# Patient Record
Sex: Female | Born: 1960 | Race: Asian | Hispanic: No | Marital: Married | Smoking: Never smoker
Health system: Southern US, Community
[De-identification: ages and names within clinical notes are randomized; demographics above are authoritative.]

## PROBLEM LIST (undated history)

## (undated) DIAGNOSIS — J45909 Unspecified asthma, uncomplicated: Secondary | ICD-10-CM

## (undated) DIAGNOSIS — E039 Hypothyroidism, unspecified: Secondary | ICD-10-CM

## (undated) HISTORY — PX: COLONOSCOPY: SHX174

## (undated) HISTORY — PX: BUNIONECTOMY: SHX129

---

## 2017-08-23 ENCOUNTER — Other Ambulatory Visit (HOSPITAL_BASED_OUTPATIENT_CLINIC_OR_DEPARTMENT_OTHER): Payer: Self-pay | Admitting: Nurse Practitioner

## 2017-08-23 DIAGNOSIS — Z1231 Encounter for screening mammogram for malignant neoplasm of breast: Secondary | ICD-10-CM

## 2017-08-29 ENCOUNTER — Encounter (HOSPITAL_BASED_OUTPATIENT_CLINIC_OR_DEPARTMENT_OTHER): Payer: Self-pay

## 2017-08-29 ENCOUNTER — Ambulatory Visit (HOSPITAL_BASED_OUTPATIENT_CLINIC_OR_DEPARTMENT_OTHER)
Admission: RE | Admit: 2017-08-29 | Discharge: 2017-08-29 | Disposition: A | Discharge status: Home / Self Care | Payer: BLUE CROSS/BLUE SHIELD | Source: Ambulatory Visit | Attending: Nurse Practitioner | Admitting: Nurse Practitioner

## 2017-08-29 DIAGNOSIS — Z1231 Encounter for screening mammogram for malignant neoplasm of breast: Secondary | ICD-10-CM | POA: Diagnosis not present

## 2018-06-13 ENCOUNTER — Inpatient Hospital Stay (HOSPITAL_COMMUNITY)
Admission: EM | Admit: 2018-06-13 | Discharge: 2018-06-18 | DRG: 872 | Disposition: A | Discharge status: Home / Self Care | Payer: BLUE CROSS/BLUE SHIELD | Attending: Internal Medicine | Admitting: Internal Medicine

## 2018-06-13 ENCOUNTER — Emergency Department (HOSPITAL_COMMUNITY): Payer: BLUE CROSS/BLUE SHIELD

## 2018-06-13 DIAGNOSIS — Z79899 Other long term (current) drug therapy: Secondary | ICD-10-CM | POA: Diagnosis not present

## 2018-06-13 DIAGNOSIS — R5383 Other fatigue: Secondary | ICD-10-CM | POA: Diagnosis not present

## 2018-06-13 DIAGNOSIS — R197 Diarrhea, unspecified: Secondary | ICD-10-CM | POA: Diagnosis not present

## 2018-06-13 DIAGNOSIS — Z79890 Hormone replacement therapy (postmenopausal): Secondary | ICD-10-CM

## 2018-06-13 DIAGNOSIS — D72819 Decreased white blood cell count, unspecified: Secondary | ICD-10-CM | POA: Diagnosis not present

## 2018-06-13 DIAGNOSIS — D61818 Other pancytopenia: Secondary | ICD-10-CM | POA: Diagnosis present

## 2018-06-13 DIAGNOSIS — L400 Psoriasis vulgaris: Secondary | ICD-10-CM | POA: Diagnosis not present

## 2018-06-13 DIAGNOSIS — M6282 Rhabdomyolysis: Secondary | ICD-10-CM | POA: Diagnosis present

## 2018-06-13 DIAGNOSIS — A021 Salmonella sepsis: Secondary | ICD-10-CM | POA: Diagnosis present

## 2018-06-13 DIAGNOSIS — E872 Acidosis: Secondary | ICD-10-CM | POA: Diagnosis present

## 2018-06-13 DIAGNOSIS — R740 Nonspecific elevation of levels of transaminase and lactic acid dehydrogenase [LDH]: Secondary | ICD-10-CM | POA: Diagnosis present

## 2018-06-13 DIAGNOSIS — R51 Headache: Secondary | ICD-10-CM | POA: Diagnosis present

## 2018-06-13 DIAGNOSIS — A419 Sepsis, unspecified organism: Secondary | ICD-10-CM | POA: Diagnosis present

## 2018-06-13 DIAGNOSIS — D696 Thrombocytopenia, unspecified: Secondary | ICD-10-CM | POA: Diagnosis not present

## 2018-06-13 DIAGNOSIS — E871 Hypo-osmolality and hyponatremia: Secondary | ICD-10-CM | POA: Diagnosis present

## 2018-06-13 DIAGNOSIS — R7881 Bacteremia: Secondary | ICD-10-CM | POA: Diagnosis not present

## 2018-06-13 DIAGNOSIS — R700 Elevated erythrocyte sedimentation rate: Secondary | ICD-10-CM | POA: Diagnosis present

## 2018-06-13 DIAGNOSIS — R112 Nausea with vomiting, unspecified: Secondary | ICD-10-CM | POA: Diagnosis not present

## 2018-06-13 DIAGNOSIS — Z1611 Resistance to penicillins: Secondary | ICD-10-CM | POA: Diagnosis present

## 2018-06-13 DIAGNOSIS — Z1624 Resistance to multiple antibiotics: Secondary | ICD-10-CM | POA: Diagnosis present

## 2018-06-13 DIAGNOSIS — Z880 Allergy status to penicillin: Secondary | ICD-10-CM | POA: Diagnosis not present

## 2018-06-13 DIAGNOSIS — R509 Fever, unspecified: Secondary | ICD-10-CM

## 2018-06-13 DIAGNOSIS — E039 Hypothyroidism, unspecified: Secondary | ICD-10-CM | POA: Diagnosis present

## 2018-06-13 DIAGNOSIS — Z1623 Resistance to quinolones and fluoroquinolones: Secondary | ICD-10-CM | POA: Diagnosis present

## 2018-06-13 DIAGNOSIS — B9689 Other specified bacterial agents as the cause of diseases classified elsewhere: Secondary | ICD-10-CM

## 2018-06-13 DIAGNOSIS — J302 Other seasonal allergic rhinitis: Secondary | ICD-10-CM | POA: Diagnosis not present

## 2018-06-13 DIAGNOSIS — A0100 Typhoid fever, unspecified: Secondary | ICD-10-CM | POA: Diagnosis not present

## 2018-06-13 DIAGNOSIS — A028 Other specified salmonella infections: Secondary | ICD-10-CM | POA: Diagnosis not present

## 2018-06-13 DIAGNOSIS — E878 Other disorders of electrolyte and fluid balance, not elsewhere classified: Secondary | ICD-10-CM | POA: Diagnosis present

## 2018-06-13 DIAGNOSIS — A029 Salmonella infection, unspecified: Secondary | ICD-10-CM | POA: Diagnosis not present

## 2018-06-13 DIAGNOSIS — R110 Nausea: Secondary | ICD-10-CM | POA: Diagnosis not present

## 2018-06-13 DIAGNOSIS — Z95828 Presence of other vascular implants and grafts: Secondary | ICD-10-CM | POA: Diagnosis not present

## 2018-06-13 HISTORY — DX: Unspecified asthma, uncomplicated: J45.909

## 2018-06-13 HISTORY — DX: Hypothyroidism, unspecified: E03.9

## 2018-06-13 LAB — URINALYSIS, ROUTINE W REFLEX MICROSCOPIC
Bilirubin Urine: NEGATIVE
GLUCOSE, UA: NEGATIVE mg/dL
Ketones, ur: 20 mg/dL — AB
Leukocytes, UA: NEGATIVE
NITRITE: NEGATIVE
PROTEIN: NEGATIVE mg/dL
SPECIFIC GRAVITY, URINE: 1.014 (ref 1.005–1.030)
pH: 6 (ref 5.0–8.0)

## 2018-06-13 LAB — C-REACTIVE PROTEIN: CRP: 17.5 mg/dL — ABNORMAL HIGH (ref <1.0)

## 2018-06-13 LAB — COMPREHENSIVE METABOLIC PANEL
ALT: 87 U/L — AB (ref 0–44)
AST: 125 U/L — ABNORMAL HIGH (ref 15–41)
Albumin: 3.6 g/dL (ref 3.5–5.0)
Alkaline Phosphatase: 82 U/L (ref 38–126)
Anion gap: 9 (ref 5–15)
BUN: 12 mg/dL (ref 6–20)
CO2: 24 mmol/L (ref 22–32)
CREATININE: 0.91 mg/dL (ref 0.44–1.00)
Calcium: 9 mg/dL (ref 8.9–10.3)
Chloride: 100 mmol/L (ref 98–111)
GFR calc non Af Amer: >60 mL/min (ref >60)
Glucose, Bld: 120 mg/dL — ABNORMAL HIGH (ref 70–99)
POTASSIUM: 3.6 mmol/L (ref 3.5–5.1)
SODIUM: 133 mmol/L — AB (ref 135–145)
TOTAL PROTEIN: 7.7 g/dL (ref 6.5–8.1)
Total Bilirubin: 0.8 mg/dL (ref 0.3–1.2)

## 2018-06-13 LAB — CBC WITH DIFFERENTIAL/PLATELET
BASOS ABS: 0 10*3/uL (ref 0.0–0.1)
Basophils Relative: 0 %
EOS ABS: 0 10*3/uL (ref 0.0–0.5)
EOS PCT: 0 %
HCT: 39.9 % (ref 36.0–46.0)
Hemoglobin: 13.1 g/dL (ref 12.0–15.0)
LYMPHS PCT: 27 %
Lymphs Abs: 0.8 10*3/uL (ref 0.7–4.0)
MCH: 26.9 pg (ref 26.0–34.0)
MCHC: 32.8 g/dL (ref 30.0–36.0)
MCV: 81.9 fL (ref 80.0–100.0)
MONO ABS: 0 10*3/uL — AB (ref 0.1–1.0)
Monocytes Relative: 1 %
NRBC: 0 % (ref 0.0–0.2)
Neutro Abs: 2.2 10*3/uL (ref 1.7–7.7)
Neutrophils Relative %: 72 %
PLATELETS: 140 10*3/uL — AB (ref 150–400)
RBC: 4.87 MIL/uL (ref 3.87–5.11)
RDW: 14.3 % (ref 11.5–15.5)
WBC: 3 10*3/uL — AB (ref 4.0–10.5)
nRBC: 0 /100 WBC

## 2018-06-13 LAB — SEDIMENTATION RATE: SED RATE: 44 mm/h — AB (ref 0–22)

## 2018-06-13 LAB — CREATININE, SERUM
Creatinine, Ser: 0.85 mg/dL (ref 0.44–1.00)
GFR calc Af Amer: >60 mL/min (ref >60)
GFR calc non Af Amer: >60 mL/min (ref >60)

## 2018-06-13 LAB — LIPASE, BLOOD: LIPASE: 25 U/L (ref 11–51)

## 2018-06-13 LAB — CBC
HEMATOCRIT: 36.6 % (ref 36.0–46.0)
Hemoglobin: 11.5 g/dL — ABNORMAL LOW (ref 12.0–15.0)
MCH: 26.5 pg (ref 26.0–34.0)
MCHC: 31.4 g/dL (ref 30.0–36.0)
MCV: 84.3 fL (ref 80.0–100.0)
Platelets: 134 10*3/uL — ABNORMAL LOW (ref 150–400)
RBC: 4.34 MIL/uL (ref 3.87–5.11)
RDW: 14.3 % (ref 11.5–15.5)
WBC: 2.8 10*3/uL — AB (ref 4.0–10.5)
nRBC: 0 % (ref 0.0–0.2)

## 2018-06-13 LAB — PARASITE EXAM SCREEN, BLOOD-W CONF TO LABCORP (NOT @ ARMC)

## 2018-06-13 LAB — I-STAT CG4 LACTIC ACID, ED: Lactic Acid, Venous: 1.18 mmol/L (ref 0.5–1.9)

## 2018-06-13 MED ORDER — LEVOTHYROXINE SODIUM 75 MCG PO TABS
75.0000 ug | ORAL_TABLET | Freq: Every day | ORAL | Status: DC
  Administered 2018-06-14 – 2018-06-18 (×5): 75 ug via ORAL
  Filled 2018-06-13 (×5): qty 1

## 2018-06-13 MED ORDER — ENOXAPARIN SODIUM 40 MG/0.4ML ~~LOC~~ SOLN
40.0000 mg | Freq: Every day | SUBCUTANEOUS | Status: DC
  Administered 2018-06-14: 40 mg via SUBCUTANEOUS
  Filled 2018-06-13 (×4): qty 0.4

## 2018-06-13 MED ORDER — POLYETHYLENE GLYCOL 3350 17 G PO PACK: 17.0000 g | PACK | Freq: Every day | ORAL | Status: DC | PRN

## 2018-06-13 MED ORDER — ACETAMINOPHEN 325 MG PO TABS
650.0000 mg | ORAL_TABLET | Freq: Four times a day (QID) | ORAL | Status: DC | PRN
  Administered 2018-06-14 – 2018-06-18 (×9): 650 mg via ORAL
  Filled 2018-06-13 (×10): qty 2

## 2018-06-13 MED ORDER — SODIUM CHLORIDE 0.9% FLUSH
3.0000 mL | Freq: Two times a day (BID) | INTRAVENOUS | Status: DC
  Administered 2018-06-13 – 2018-06-17 (×5): 3 mL via INTRAVENOUS

## 2018-06-13 MED ORDER — SODIUM CHLORIDE 0.9 % IV SOLN
1000.0000 mL | INTRAVENOUS | Status: DC
  Administered 2018-06-13 (×2): 1000 mL via INTRAVENOUS

## 2018-06-13 MED ORDER — SODIUM CHLORIDE 0.9 % IV SOLN
2.0000 g | Freq: Once | INTRAVENOUS | Status: AC
  Administered 2018-06-13: 2 g via INTRAVENOUS
  Filled 2018-06-13: qty 2

## 2018-06-13 MED ORDER — FAMOTIDINE IN NACL 20-0.9 MG/50ML-% IV SOLN
20.0000 mg | Freq: Once | INTRAVENOUS | Status: AC
  Administered 2018-06-13: 20 mg via INTRAVENOUS
  Filled 2018-06-13: qty 50

## 2018-06-13 MED ORDER — ONDANSETRON HCL 4 MG/2ML IJ SOLN
4.0000 mg | Freq: Four times a day (QID) | INTRAMUSCULAR | Status: DC | PRN
  Administered 2018-06-14 – 2018-06-15 (×5): 4 mg via INTRAVENOUS
  Filled 2018-06-13 (×5): qty 2

## 2018-06-13 MED ORDER — IBUPROFEN 800 MG PO TABS
800.0000 mg | ORAL_TABLET | Freq: Once | ORAL | Status: AC
  Administered 2018-06-13: 800 mg via ORAL
  Filled 2018-06-13: qty 1

## 2018-06-13 MED ORDER — ONDANSETRON HCL 4 MG/2ML IJ SOLN
4.0000 mg | Freq: Once | INTRAMUSCULAR | Status: AC
  Administered 2018-06-13: 4 mg via INTRAVENOUS
  Filled 2018-06-13: qty 2

## 2018-06-13 MED ORDER — SODIUM CHLORIDE 0.9 % IV BOLUS (SEPSIS)
1000.0000 mL | Freq: Once | INTRAVENOUS | Status: AC
  Administered 2018-06-13: 1000 mL via INTRAVENOUS

## 2018-06-13 MED ORDER — ACETAMINOPHEN 650 MG RE SUPP: 650.0000 mg | Freq: Four times a day (QID) | RECTAL | Status: DC | PRN

## 2018-06-13 MED ORDER — SODIUM CHLORIDE 0.9 % IV SOLN
500.0000 mg | Freq: Four times a day (QID) | INTRAVENOUS | Status: DC
  Administered 2018-06-14 (×2): 500 mg via INTRAVENOUS
  Filled 2018-06-13 (×4): qty 500

## 2018-06-13 MED ORDER — KETOROLAC TROMETHAMINE 30 MG/ML IJ SOLN: 30.0000 mg | Freq: Four times a day (QID) | INTRAMUSCULAR | Status: DC | PRN

## 2018-06-13 MED ORDER — ONDANSETRON HCL 4 MG PO TABS: 4.0000 mg | ORAL_TABLET | Freq: Four times a day (QID) | ORAL | Status: DC | PRN

## 2018-06-13 NOTE — ED Notes (Signed)
Patient transported to X-ray 

## 2018-06-13 NOTE — Progress Notes (Signed)
Pharmacy Antibiotic Note  Joyce Morgan is a 57 y.o. female admitted on 06/13/2018 with chief complaint of fever, headache, nausea, emesis and diarrhea. Planning to start Primaxin empirically. Tm 103.2.   Plan: -Primaxin 500 mg IV q6h -F/u outside blood cultures -Monitor renal fx, cultures, length of therapy     Temp (24hrs), Avg:103.2 F (39.6 C), Min:103.2 F (39.6 C), Max:103.2 F (39.6 C)  Recent Labs  Lab 06/13/18 1752 06/13/18 1949  WBC 3.0*  --   CREATININE 0.91  --   LATICACIDVEN  --  1.18     Antimicrobials this admission: 11/7 cefepime x1 11/7 Primaxin >  Dose adjustments this admission: N/A  Microbiology results: 11/7 blood cx:   Joyce Morgan 06/13/2018 10:14 PM

## 2018-06-13 NOTE — H&P (Addendum)
Date: 06/13/2018               Patient Name:  Joyce Morgan MRN: [id number]  DOB: Apr 01, 1961 Age / Sex: 57 y.o., female   PCP: Joyce Number, MD         Medical Service: Internal Medicine Teaching Service         Attending Physician: Dr. Rebeca Morgan    First Contact: Dr. Myrtie Morgan  Pager: [phone]  Second Contact: Dr. Tarri Morgan Pager: [phone]       After Hours (After 5p/  First Contact Pager: [phone]  weekends / holidays): Second Contact Pager: [phone]   Chief Complaint: Fever, Nausea, Vomiting, headache   History of Present Illness:   [name] is a 57 year old pleasant woman with hypothyroidism and recent travel to [place] presenting for evaluation of fever, nausea, vomiting and headache.  She recently traveled to [place] from 10/8 to 10/29.  Reports that 3 days after arrival, she began experiencing fevers (102F - 103F) and chills.  Also reports of simultaneously developing headache localized to left temporal and retro-orbital area though she currently does not report of headache.  Since onset, she has also experience nausea and nonbloody nonbilious emesis.  She was evaluated by her PCP and states that she was given an antibiotic injection, steroid and Zofran for presumed diagnosis of pneumonia..  She received a call today and was told her blood culture grew gram-negative rods and was started on Levaquin.  She reports that during her trip to [place], she had a 2-day history of nonbloody diarrhea which resolved with Imodium. She denies neck pain, focal neurological deficits, dysuria, urinary frequency, urinary urgency, flank pain, upper respiratory viral symptoms, chest pain, shortness of breath, myalgia, arthralgia, petechiae or any skin rashes.  She does report of recent tooth filling while she was in [place].  ED course: Febrile to 103.2, tachycardic to 124, RR 16, BP 140/79, SPO2 100% on room air.  CMP shows mild hyponatremia of 133, transaminitis with elevated AST of 125, ALT of  87, CBC with leukopenia and mild thrombocytopenia, elevated ESR of 64, with a CRP of 17.5.  Received 1 L NS and was started on cefepime.   Meds:  Current Meds  Medication Sig  . cetirizine (ZYRTEC) 10 MG tablet Take 10 mg by mouth daily as needed for allergies.  . Cyanocobalamin (VITAMIN B-12 IJ) Inject 1,000 Units as directed every 14 (fourteen) days.  . fexofenadine (ALLEGRA) 180 MG tablet Take 180 mg by mouth daily as needed for allergies or rhinitis.  Marland Kitchen levothyroxine (SYNTHROID, LEVOTHROID) 75 MCG tablet Take 75 mcg by mouth daily before breakfast.  . Vitamin D, Ergocalciferol, (DRISDOL) 1.25 MG (50000 UT) CAPS capsule Take 50,000 Units by mouth every 7 (seven) days.     Allergies: Allergies as of 06/13/2018 - Review Complete 06/13/2018  Allergen Reaction Noted  . Penicillins Rash 06/13/2018   No past medical history on file. -Hypothyroidism - Psoriasis - Migraine headache  Social History: Denies EtOH, tobacco, IV drug use or illicit drug use.  Review of Systems: A complete ROS was negative except as per HPI.   Physical Exam: Blood pressure 140/80, pulse (!) 101, temperature (!) 103.2 F (39.6 C), temperature source Oral, resp. rate 16, SpO2 99 %.  Physical Exam  Constitutional: She is well-developed, well-nourished, and in no distress. No distress.  HENT:  Head: Normocephalic and atraumatic.  Eyes: Conjunctivae are normal.  Neck: Normal range of motion. Neck supple.  Cardiovascular: Tachycardia present. Exam reveals  no gallop and no friction rub.  No murmur heard. Pulmonary/Chest: Effort normal and breath sounds normal. No respiratory distress. She has no wheezes. She has no rales.  Abdominal: Soft. Bowel sounds are normal. She exhibits no distension. There is no tenderness. There is no rebound.  Musculoskeletal: Normal range of motion. She exhibits no edema, tenderness or deformity.  Neurological: She is Morgan.  Skin: Skin is warm. No rash noted. She is not  diaphoretic. No erythema. No pallor.  Psoriatic lesion on the right knee    CXR: personally reviewed my interpretation is unremarkable  Assessment & Plan by Problem: Active Problems:   Fever and chills  Mrs. Joyce Morgan is a 57 year old pleasant woman presenting with 5-day history of fever, headache, nausea, non-bilious nonbloody emesis and 2-day history of nonbloody diarrhea following recent travel to [place].  Gram-negative bacteremia: Suspecting typhoid fever vs. Dengue fever. Recent travel to [place] with 2-day history of nonbloody diarrhea trying to visit.  3 days following the visit she began developing fevers with T-max of 103, right temporal headache, nausea, nonbilious nonbloody emesis.  Evaluated outpatient with blood cultures which grew gram-negative bacteria.  Denies neck pain, focal neurological deficits, myalgia, arthralgia, petechiae, chest pain, cough, UTI symptoms.  On arrival to the ED, patient was febrile to 103, tachycardic with range 101-124, CMP shows mild hyponatremia of 133, transaminitis with elevated AST of 125, ALT of 87, CBC with leukopenia and mild thrombocytopenia, elevated ESR of 64, with a CRP of 17.5.  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  Infectious disease was consulted and advised for patient to be started on carbapenem.  Given her recent history of tooth filling, bacteremia from an oral pharyngeal source cannot be ruled out. -s/p cefepime, normal saline bolus -Continue imipenem-cilastatin -Follow-up urinalysis, blood culture, lactic acid, blood smear -Follow-up a.m. CBC, CMP - Zofran as needed nausea, Toradol 30 mg q6 during  headache  Hypothyroidism: Continue levothyroxine  History of headaches: Patient reports that she takes sumatriptan  FEN: Replace electrolytes as needed, he continues maintenance normal saline at 125 mL/hour VTE prophylaxis: Subcutaneous Lovenox CODE STATUS: Full code  Dispo: Admit patient to Inpatient with expected length of stay greater than 2 midnights.  Signed: Jean Rosenthal, MD 06/13/2018, 9:44 PM  Pager: [phone] [facility] PGY-1

## 2018-06-13 NOTE — ED Provider Notes (Signed)
[facility] EMERGENCY DEPARTMENT Provider Note   CSN: [id number] Arrival date & time: 06/13/18  1632     History   Chief Complaint Chief Complaint  Patient presents with  . Nausea  . Fever    HPI Joyce Morgan is a 57 y.o. female.  HPI Patient reports that she started becoming sick 5 days ago.  On the first day she had chills and a fever.  She reports on that day she had a headache.  The headache was moderate and generalized.  She was seen by her doctor and given an antibiotic shot, steroid and Zofran.  She reports that the fever did not go away.  She has continued to have a fever for all 5 days since it started.  She reports that the headache has resolved and she no longer has that.  She reports she has continued to have nausea and vomiting if she tries to eat something.  She reports that she was seen at [facility] hospital 2 days ago and diagnostic testing was negative.  She reports that influenza tests have been done twice and are negative.  She was given a prescription for Levaquin which she just started today and vomited.  She reports that urinalysis reportedly has tested negative each time.  She denies any chest pain, cough, shortness of breath, abdominal pain, diarrhea.  No pain or burning with urination.  Patient reports she has minimal past medical history.  She takes levothyroxine and a medication for seasonal allergies.  She does have recent travel history.  She reports that she was in [place] in the end of September.  She then was in [place] for about 3 weeks.  She reports that she was never sick for the whole time she was there. No past medical history on file.  Patient Active Problem List   Diagnosis Date Noted  . Fever and chills 06/13/2018     The histories are not reviewed yet. Please review them in the "History" navigator section and refresh this SmartLink.   OB History   None      Home Medications    Prior to Admission medications     Medication Sig Start Date End Date Taking? Authorizing Provider  cetirizine (ZYRTEC) 10 MG tablet Take 10 mg by mouth daily as needed for allergies.   Yes [provider]  Cyanocobalamin (VITAMIN B-12 IJ) Inject 1,000 Units as directed every 14 (fourteen) days.   Yes [provider]  fexofenadine (ALLEGRA) 180 MG tablet Take 180 mg by mouth daily as needed for allergies or rhinitis.   Yes [provider]  levothyroxine (SYNTHROID, LEVOTHROID) 75 MCG tablet Take 75 mcg by mouth daily before breakfast.   Yes [provider]  Vitamin D, Ergocalciferol, (DRISDOL) 1.25 MG (50000 UT) CAPS capsule Take 50,000 Units by mouth every 7 (seven) days.   Yes [provider]    Family History No family history on file.  Social History Social History   Tobacco Use  . Smoking status: Not on file  Substance Use Topics  . Alcohol use: Not on file  . Drug use: Not on file     Allergies   Penicillins   Review of Systems Review of Systems 10 Systems reviewed and are negative for acute change except as noted in the HPI.  Physical Exam Updated Vital Signs BP 102/81   Pulse 90   Temp 99.8 F (37.7 C) (Oral)   Resp 20   Ht 5\' 2"  (1.575  m)   Wt 76.7 kg   SpO2 97%   BMI 30.91 kg/m   Physical Exam  Constitutional: She is oriented to person, place, and time. She appears well-developed and well-nourished.  Patient is clinically well in appearance.  She is alert and nontoxic.  No signs of distress.  HENT:  Head: Normocephalic and atraumatic.  Bilateral TMs normal.  Mucous membranes are slightly dry on the tongue.  Posterior oropharynx is widely patent.  No erythema, exudate or ulcerative lesions.  Eyes: Pupils are equal, round, and reactive to light. EOM are normal.  Neck: Neck supple.  Cardiovascular: Normal rate, regular rhythm, normal heart sounds and intact distal pulses.  Pulmonary/Chest: Effort normal and breath sounds normal.  Abdominal:  Soft. Bowel sounds are normal. She exhibits no distension. There is no tenderness.  Musculoskeletal: Normal range of motion. She exhibits no edema or tenderness.  Patient has a plaque of psoriasis on the right knee.  This is chronic.  No surrounding erythema.  Lower extremities are without edema.  Calves are soft and nontender.  Neurological: She is alert and oriented to person, place, and time. She has normal strength. She exhibits normal muscle tone. Coordination normal. GCS eye subscore is 4. GCS verbal subscore is 5. GCS motor subscore is 6.  Skin: Skin is warm, dry and intact.  Psychiatric: She has a normal mood and affect.     ED Treatments / Results  Labs (all labs ordered are listed, but only abnormal results are displayed) Labs Reviewed  COMPREHENSIVE METABOLIC PANEL - Abnormal; Notable for the following components:      Result Value   Sodium 133 (*)    Glucose, Bld 120 (*)    AST 125 (*)    ALT 87 (*)    All other components within normal limits  CBC WITH DIFFERENTIAL/PLATELET - Abnormal; Notable for the following components:   WBC 3.0 (*)    Platelets 140 (*)    Monocytes Absolute 0.0 (*)    All other components within normal limits  URINALYSIS, ROUTINE W REFLEX MICROSCOPIC - Abnormal; Notable for the following components:   Hgb urine dipstick MODERATE (*)    Ketones, ur 20 (*)    Bacteria, UA RARE (*)    All other components within normal limits  SEDIMENTATION RATE - Abnormal; Notable for the following components:   Sed Rate 44 (*)    All other components within normal limits  C-REACTIVE PROTEIN - Abnormal; Notable for the following components:   CRP 17.5 (*)    All other components within normal limits  CBC - Abnormal; Notable for the following components:   WBC 2.8 (*)    Hemoglobin 11.5 (*)    Platelets 134 (*)    All other components within normal limits  CULTURE, BLOOD (ROUTINE X 2)  CULTURE, BLOOD (ROUTINE X 2)  LIPASE, BLOOD  PARASITE EXAM SCREEN,  BLOOD-W CONF TO [facility] (NOT @ [facility])  CREATININE, SERUM  HIV ANTIBODY (ROUTINE TESTING W REFLEX)  COMPREHENSIVE METABOLIC PANEL  CBC  PARASITE EXAM, BLOOD  I-STAT CG4 LACTIC ACID, ED  I-STAT CG4 LACTIC ACID, ED    EKG None  Radiology Dg Chest 2 View  Result Date: 06/13/2018 CLINICAL DATA:  Fever. EXAM: CHEST - 2 VIEW COMPARISON:  None. FINDINGS: The heart size and mediastinal contours are within normal limits. Both lungs are clear. The visualized skeletal structures are unremarkable. IMPRESSION: No active cardiopulmonary disease. Electronically Signed   By: Gerome Sam III M.D  On: 06/13/2018 20:34    Procedures Procedures (including critical care time)  Medications Ordered in ED Medications  sodium chloride 0.9 % bolus 1,000 mL (0 mLs Intravenous Stopped 06/13/18 2114)    Followed by  0.9 %  sodium chloride infusion (1,000 mLs Intravenous New Bag/Given 06/13/18 2327)  enoxaparin (LOVENOX) injection 40 mg (has no administration in time range)  sodium chloride flush (NS) 0.9 % injection 3 mL (has no administration in time range)  acetaminophen (TYLENOL) tablet 650 mg (has no administration in time range)    Or  acetaminophen (TYLENOL) suppository 650 mg (has no administration in time range)  ondansetron (ZOFRAN) tablet 4 mg (has no administration in time range)    Or  ondansetron (ZOFRAN) injection 4 mg (has no administration in time range)  polyethylene glycol (MIRALAX / GLYCOLAX) packet 17 g (has no administration in time range)  levothyroxine (SYNTHROID, LEVOTHROID) tablet 75 mcg (has no administration in time range)  ketorolac (TORADOL) 30 MG/ML injection 30 mg (has no administration in time range)  imipenem-cilastatin (PRIMAXIN) 500 mg in sodium chloride 0.9 % 100 mL IVPB (has no administration in time range)  ibuprofen (ADVIL,MOTRIN) tablet 800 mg (800 mg Oral Given 06/13/18 2225)  famotidine (PEPCID) IVPB 20 mg premix (0 mg Intravenous Stopped 06/13/18 1818)   ondansetron (ZOFRAN) injection 4 mg (4 mg Intravenous Given 06/13/18 1748)  ceFEPIme (MAXIPIME) 2 g in sodium chloride 0.9 % 100 mL IVPB (0 g Intravenous Stopped 06/13/18 2127)     Initial Impression / Assessment and Plan / ED Course  I have reviewed the triage vital signs and the nursing notes.  Pertinent labs & imaging results that were available during my care of the patient were reviewed by me and considered in my medical decision making (see chart for details).  Clinical Course as of Jun 14 52  Thu Jun 13, 2018  1722 If patient needs admission, plan to admit to internal medicine teaching service even if capped.   [MP]    Clinical Course User Index [MP] Arby Barrette, MD   Patient presents with fever that started 5 days ago.  She has had travel to [place].  Differential includes dengue, typhoid, other bacterial infection.  Patient is alert and appropriate.  She is not showing signs of being septic with endorgan damage..  No hypotension, no lactic acidosis.  Patient is tachycardic.  She reports vomiting.  Fluid resuscitation initiated.  Broad-spectrum antibiotics initiated based on a report showing  gram-negative rods from prior blood culture done in outside facility.  Patient admitted to internal medicine teaching service.  Consultation to Dr. Earle Gell of infectious disease called and forwarded to admitting physicians.  Final Clinical Impressions(s) / ED Diagnoses   Final diagnoses:  Fever, unspecified fever cause  Bacteremia    ED Discharge Orders    None       Arby Barrette, MD 06/20/18 [phone]

## 2018-06-13 NOTE — ED Notes (Signed)
Unable to collect istat lactic acid d/t limited blood. Per RN can be collected off next lab order. [employer]

## 2018-06-13 NOTE — ED Notes (Signed)
Delay in abx start due to culture draw difficulties.

## 2018-06-13 NOTE — ED Triage Notes (Signed)
Pt c/o "mild" headache and n/v x5 days with increasing symptoms over the past 2 days. Pt states she has been seen by multiple providers over the past few days without resolve. Pt vomited since arrival to ED. EDP at bedside.

## 2018-06-14 ENCOUNTER — Other Ambulatory Visit: Payer: Self-pay

## 2018-06-14 ENCOUNTER — Encounter (HOSPITAL_COMMUNITY): Payer: Self-pay | Admitting: *Deleted

## 2018-06-14 DIAGNOSIS — A419 Sepsis, unspecified organism: Secondary | ICD-10-CM | POA: Diagnosis present

## 2018-06-14 DIAGNOSIS — J302 Other seasonal allergic rhinitis: Secondary | ICD-10-CM

## 2018-06-14 DIAGNOSIS — R509 Fever, unspecified: Secondary | ICD-10-CM

## 2018-06-14 DIAGNOSIS — D61818 Other pancytopenia: Secondary | ICD-10-CM | POA: Diagnosis present

## 2018-06-14 DIAGNOSIS — E039 Hypothyroidism, unspecified: Secondary | ICD-10-CM

## 2018-06-14 DIAGNOSIS — R112 Nausea with vomiting, unspecified: Secondary | ICD-10-CM

## 2018-06-14 DIAGNOSIS — R51 Headache: Secondary | ICD-10-CM

## 2018-06-14 DIAGNOSIS — R197 Diarrhea, unspecified: Secondary | ICD-10-CM

## 2018-06-14 DIAGNOSIS — D72819 Decreased white blood cell count, unspecified: Secondary | ICD-10-CM

## 2018-06-14 DIAGNOSIS — R7881 Bacteremia: Secondary | ICD-10-CM

## 2018-06-14 DIAGNOSIS — D696 Thrombocytopenia, unspecified: Secondary | ICD-10-CM

## 2018-06-14 DIAGNOSIS — Z880 Allergy status to penicillin: Secondary | ICD-10-CM

## 2018-06-14 DIAGNOSIS — M6282 Rhabdomyolysis: Secondary | ICD-10-CM | POA: Diagnosis present

## 2018-06-14 LAB — BLOOD CULTURE ID PANEL (REFLEXED)
Acinetobacter baumannii: NOT DETECTED — AB
Candida albicans: NOT DETECTED — AB
Candida glabrata: NOT DETECTED — AB
Candida krusei: NOT DETECTED — AB
Candida parapsilosis: NOT DETECTED — AB
Candida tropicalis: NOT DETECTED — AB
Carbapenem resistance: NOT DETECTED — AB
Enterobacter cloacae complex: NOT DETECTED — AB
Enterobacteriaceae species: DETECTED — AB
Enterococcus species: NOT DETECTED — AB
Escherichia coli: NOT DETECTED — AB
Haemophilus influenzae: NOT DETECTED — AB
Klebsiella oxytoca: NOT DETECTED — AB
Klebsiella pneumoniae: NOT DETECTED — AB
Listeria monocytogenes: NOT DETECTED — AB
Methicillin resistance: NOT DETECTED — AB
Neisseria meningitidis: NOT DETECTED — AB
Proteus species: NOT DETECTED — AB
Pseudomonas aeruginosa: NOT DETECTED — AB
Serratia marcescens: NOT DETECTED — AB
Staphylococcus aureus (BCID): NOT DETECTED — AB
Staphylococcus species: NOT DETECTED — AB
Streptococcus agalactiae: NOT DETECTED — AB
Streptococcus pneumoniae: NOT DETECTED — AB
Streptococcus pyogenes: NOT DETECTED — AB
Streptococcus species: NOT DETECTED — AB
Vancomycin resistance: NOT DETECTED — AB

## 2018-06-14 LAB — CBC
HCT: 35.2 % — ABNORMAL LOW (ref 36.0–46.0)
Hemoglobin: 11.2 g/dL — ABNORMAL LOW (ref 12.0–15.0)
MCH: 26.1 pg (ref 26.0–34.0)
MCHC: 31.8 g/dL (ref 30.0–36.0)
MCV: 82.1 fL (ref 80.0–100.0)
Platelets: 111 10*3/uL — ABNORMAL LOW (ref 150–400)
RBC: 4.29 MIL/uL (ref 3.87–5.11)
RDW: 14.4 % (ref 11.5–15.5)
WBC: 3.2 10*3/uL — ABNORMAL LOW (ref 4.0–10.5)
nRBC: 0 % (ref 0.0–0.2)

## 2018-06-14 LAB — COMPREHENSIVE METABOLIC PANEL WITH GFR
ALT: 63 U/L — ABNORMAL HIGH (ref 0–44)
AST: 94 U/L — ABNORMAL HIGH (ref 15–41)
Albumin: 2.8 g/dL — ABNORMAL LOW (ref 3.5–5.0)
Alkaline Phosphatase: 63 U/L (ref 38–126)
Anion gap: 8 (ref 5–15)
BUN: 8 mg/dL (ref 6–20)
CO2: 18 mmol/L — ABNORMAL LOW (ref 22–32)
Calcium: 8 mg/dL — ABNORMAL LOW (ref 8.9–10.3)
Chloride: 110 mmol/L (ref 98–111)
Creatinine, Ser: 0.92 mg/dL (ref 0.44–1.00)
GFR calc Af Amer: >60 mL/min
GFR calc non Af Amer: >60 mL/min
Glucose, Bld: 114 mg/dL — ABNORMAL HIGH (ref 70–99)
Potassium: 3.1 mmol/L — ABNORMAL LOW (ref 3.5–5.1)
Sodium: 136 mmol/L (ref 135–145)
Total Bilirubin: 0.9 mg/dL (ref 0.3–1.2)
Total Protein: 6.1 g/dL — ABNORMAL LOW (ref 6.5–8.1)

## 2018-06-14 LAB — HEMOGLOBIN A1C
Hgb A1c MFr Bld: 5.6 % (ref 4.8–5.6)
Mean Plasma Glucose: 114.02 mg/dL

## 2018-06-14 LAB — HIV ANTIBODY (ROUTINE TESTING W REFLEX): HIV Screen 4th Generation wRfx: NONREACTIVE

## 2018-06-14 LAB — CK: Total CK: 1644 U/L — ABNORMAL HIGH (ref 38–234)

## 2018-06-14 MED ORDER — POTASSIUM CHLORIDE CRYS ER 20 MEQ PO TBCR
60.0000 meq | EXTENDED_RELEASE_TABLET | Freq: Once | ORAL | Status: AC
  Administered 2018-06-14: 60 meq via ORAL
  Filled 2018-06-14: qty 3

## 2018-06-14 MED ORDER — SODIUM CHLORIDE 0.9 % IV SOLN
1.0000 g | Freq: Three times a day (TID) | INTRAVENOUS | Status: DC
  Administered 2018-06-14 – 2018-06-18 (×12): 1 g via INTRAVENOUS
  Filled 2018-06-14 (×14): qty 1

## 2018-06-14 MED ORDER — CALCIUM CARBONATE ANTACID 500 MG PO CHEW
2.0000 | CHEWABLE_TABLET | Freq: Three times a day (TID) | ORAL | Status: DC | PRN
  Administered 2018-06-14: 400 mg via ORAL
  Filled 2018-06-14: qty 2

## 2018-06-14 NOTE — Progress Notes (Signed)
Called [facility] about culture results. Unfortunately they are still pending but expected to be complete on 06/15/2018. I have requested that they fax the results to the IM office but will attempt to obtain the results tomorrow.   Phone #: [phone] Specimen ID: [id number]

## 2018-06-14 NOTE — Progress Notes (Signed)
Subjective: Joyce Morgan was seen and evaluated at bedside on morning rounds. She endorses that she feels much better today than a week ago. Denies any fever, body ache, cough. No diarrhea. No more headache. Denies any sick contact during her trip or after that. We talked about her lab result and plan of care. She endorses that she has a recent abdominal Ct scan in another center and will provide [place] information and agreement for releasing the result so we can follow up with the result.   Objective:  Vital signs in last 24 hours: Vitals:   06/13/18 2242 06/13/18 2318 06/14/18 0047 06/14/18 0438  BP:  102/81 109/67 125/72  Pulse:  90 81 75  Resp:  _0 Temp: (!) 103 F (39.4 C) 99.8 F (37.7 C) 99.5 F (37.5 C) 98.9 F (37.2 C)  TempSrc: Oral Oral Oral Oral  SpO2:  97% 99% 99%  Weight:  76.7 kg    Height:  5' 2" (1.575 m)     Physical exam: General: Pleasant lady, lying in bed in no acute distress. Head and neck: Small non tender, flexible,  ymph node palpable at left side of the neck CV: RRR, no murmur Lungs: CTA bilaterally: No rale, no wheeze Abdomen: BS are present, abdomen is soft and non tender Extremities: Psoriatic skin lesion on right knee, no lower extremity edema, pulses are palpable and normal bilaterally Psychiatric: Normal mood and affect and behavior, normal judgment Neurologic exam: Alert and oriented x3, no neurologic deficit  Assessment/Plan:  Active Problems:   Fever and chills Ms. Loughner is a 57 year old female presented with fever and chills, and reported gram-negative bacteremia on blood culture obtained in outpatient clinic.  She had a recent travel to [place].  Sepsis with Gr - rod bacteriemia: Arrival: Patient had fever, leukopenia, mild thrombocytopenia ESR is 64 and CRP of 17.5 on arrival transaminitis with AST of 125 and ALT of 87. No plasmodium or other parasites on blood smear.  Final results still pending ID is on board. Differential: This  dengue versus typhoid fever versus viral gastroenteritis.  Gram-negative bacteremia more concerning for typhoid.  -Changed imipenem-cilastatin to meropenem by ID for formulary reasons. -Follow-up blood culture results from [facility] -C/w Tylenol PRN for pain and Zofran PRN  Hypothyroidism: Continue levothyroxine  Dispo: Anticipated discharge in approximately 2-3 days  Joyce Hatch, MD 06/14/2018, 8:16 AM Pager: [phone]

## 2018-06-14 NOTE — Progress Notes (Signed)
   06/14/18 1856  MEWS Score  Temp (!) 103.1 F (39.5 C)  MEWS Score  MEWS RR 0  MEWS Pulse 0  MEWS Systolic 0  MEWS LOC 0  MEWS Temp 2  MEWS Score 2  MEWS Score Color Yellow  Pt given 650mg  tylenol PO.

## 2018-06-14 NOTE — Progress Notes (Signed)
PHARMACY - PHYSICIAN COMMUNICATION CRITICAL VALUE ALERT - BLOOD CULTURE IDENTIFICATION (BCID)  Joyce Morgan is an 57 y.o. female who presented to [facility] on 06/13/2018 with gram negative rod bacteremia after returning from [place]. Suspicion for Salmonella typhi infection.   Assessment:  BCID revealed Enterobacteriaceae species, 1 of 4 bottle thus far.  Name of physician (or Provider) Contacted: Dr. Maryla Morrow  Current antibiotics: Merrem  Changes to prescribed antibiotics recommended:  Patient is on recommended antibiotics - No changes needed  F/U identification of pathogen and sensitivity before narrowing   Results for orders placed or performed during the hospital encounter of 06/13/18  Blood Culture ID Panel (Reflexed) (Collected: 06/13/2018  7:40 PM)  Result Value Ref Range   Enterococcus species NONE DETECTED (A) NOT DETECTED   Vancomycin resistance NONE DETECTED (A) NOT DETECTED   Listeria monocytogenes NONE DETECTED (A) NOT DETECTED   Staphylococcus species NONE DETECTED (A) NOT DETECTED   Staphylococcus aureus (BCID) NONE DETECTED (A) NOT DETECTED   Methicillin resistance NONE DETECTED (A) NOT DETECTED   Streptococcus species NONE DETECTED (A) NOT DETECTED   Streptococcus agalactiae NONE DETECTED (A) NOT DETECTED   Streptococcus pneumoniae NONE DETECTED (A) NOT DETECTED   Streptococcus pyogenes NONE DETECTED (A) NOT DETECTED   Acinetobacter baumannii NONE DETECTED (A) NOT DETECTED   Enterobacteriaceae species DETECTED (A) NOT DETECTED   Enterobacter cloacae complex NONE DETECTED (A) NOT DETECTED   Escherichia coli NONE DETECTED (A) NOT DETECTED   Klebsiella oxytoca NONE DETECTED (A) NOT DETECTED   Klebsiella pneumoniae NONE DETECTED (A) NOT DETECTED   Proteus species NONE DETECTED (A) NOT DETECTED   Serratia marcescens NONE DETECTED (A) NOT DETECTED   Carbapenem resistance NONE DETECTED (A) NOT DETECTED   Haemophilus influenzae NONE DETECTED (A) NOT DETECTED   Neisseria meningitidis NONE DETECTED (A) NOT DETECTED   Pseudomonas aeruginosa NONE DETECTED (A) NOT DETECTED   Candida albicans NONE DETECTED (A) NOT DETECTED   Candida glabrata NONE DETECTED (A) NOT DETECTED   Candida krusei NONE DETECTED (A) NOT DETECTED   Candida parapsilosis NONE DETECTED (A) NOT DETECTED   Candida tropicalis NONE DETECTED (A) NOT DETECTED     Thamara Leger D. Laney Potash, PharmD, BCPS, BCCCP 06/14/2018, 6:47 PM

## 2018-06-14 NOTE — Consult Note (Signed)
[facility]    Date of Admission:  06/13/2018          Reason for Consult: Gram negative bacteremia    Referring Provider: Dr. Rebeca Alert Primary Care Provider: Celedonio Miyamoto P  Assessment: [name] is a 57 yo woman found to have gram-negative bacteremia after recent travel to [place]. Pt was febrile to 103 on admission which has now resolved to 98.9. Parasite negative for plasmodium or other blood parasites on thin smear, final results still pending. Chest xray unremarkable. She remains on imipenem-cilastatin. Labs significant for AST of 125, ALT of 87, CBC with leukopenia and mild thrombocytopenia, elevated ESR of 64, with a CRP of 17.5. Differential includes dengue vs typhoid, vs viral gastroenteritis, but clinical picture much more consistent with typhoid in the setting of GNR bacteremia.   Plan: 1. Continue imipenem-cilastatin - I will change to meropenem for formulary reasons.  2. Follow micro cultures  3.  Patient advised on pretravel consultation prior to next visit   Active Problems:   Fever and chills   Scheduled Meds: . enoxaparin (LOVENOX) injection  40 mg Subcutaneous Daily  . levothyroxine  75 mcg Oral Q0600  . sodium chloride flush  3 mL Intravenous Q12H   Continuous Infusions: . imipenem-cilastatin 500 mg (06/14/18 0655)   PRN Meds:.acetaminophen **OR** acetaminophen, ketorolac, ondansetron **OR** ondansetron (ZOFRAN) IV, polyethylene glycol  HPI: Joyce Morgan is a 57 y.o. female originally from [place], recently went back to [place] from 10/8-10/29 and developed fever, chills, N/V.  Some headache but no retrobulbar headache.  Fever to 103, now more diarrhea today.  Recently had blood cultures by her PCP and growing GNRs and intially given levaquin but vomited and came in.  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  She did have an episode of presumed traveler's diarrhea with some  blood but resolved.  [place] a resort setting.     Review of Systems: Review of Systems  Constitutional: Positive for chills, fever and malaise/fatigue.  Eyes: Negative for blurred vision, double vision, photophobia and pain.  Respiratory: Negative for cough and shortness of breath.   Cardiovascular: Negative for chest pain.  Gastrointestinal: Positive for diarrhea, nausea and vomiting. Negative for abdominal pain, blood in stool and constipation.  Genitourinary: Negative.   Musculoskeletal: Negative.   Skin: Negative for itching and rash.  Neurological: Positive for headaches.    PMH: seasonal allergies, hypothyroidism  Social History   Tobacco Use  . Smoking status: Not on file  Substance Use Topics  . Alcohol use: Not on file  . Drug use: Not on file  No alcohol or drug use  FMH: + cardiac disease  Allergies  Allergen Reactions  . Penicillins Rash    OBJECTIVE: Blood pressure 125/72, pulse 75, temperature 98.9 F (37.2 C), temperature source Oral, resp. rate 18, height 5' 2"  (1.575 m), weight 76.7 kg, SpO2 99 %.  Physical Exam  Constitutional: She is oriented to person, place, and time. She appears well-developed and well-nourished.  HENT:  Head: Normocephalic and atraumatic.  Eyes: EOM are normal.  Neck: Normal range of motion.  Cardiovascular: Normal rate, regular rhythm and normal heart sounds.  Pulmonary/Chest: Effort normal and breath sounds normal. No respiratory distress.  Abdominal: Soft. She exhibits no distension and no mass. There is no tenderness. There is no guarding.  Musculoskeletal: Normal range of motion.  Neurological: She is alert and  oriented to person, place, and time.  Skin: Skin is warm and dry.  Psychiatric: She has a normal mood and affect.    Lab Results Lab Results  Component Value Date   WBC 3.2 (L) 06/14/2018   HGB 11.2 (L) 06/14/2018   HCT 35.2 (L) 06/14/2018   MCV 82.1 06/14/2018   PLT 111 (L) 06/14/2018    Lab  Results  Component Value Date   CREATININE 0.92 06/14/2018   BUN 8 06/14/2018   NA 136 06/14/2018   K 3.1 (L) 06/14/2018   CL 110 06/14/2018   CO2 18 (L) 06/14/2018    Lab Results  Component Value Date   ALT 63 (H) 06/14/2018   AST 94 (H) 06/14/2018   ALKPHOS 63 06/14/2018   BILITOT 0.9 06/14/2018     Microbiology: No results found for this or any previous visit (from the past 240 hour(s)).  April  Dione Booze, [facility] [phone] pager   [phone] cell 06/14/2018, 8:52 AM

## 2018-06-14 NOTE — Progress Notes (Signed)
Pharmacy Antibiotic Note  Joyce Morgan is a 57 y.o. female admitted on 06/13/2018 with gram negative rod bacteremia after returning from [place]. Suspicion for Salmonella typhi infection.   Pharmacy has been consulted for meropenem dosing. Patient has been treated with imipenem-cilastatin but will change to meropenem at this time to align with formulary. Meropenem will provide coverage for possible MDR S. Typhi that is of concern in travelers from [place]. Patient WBC count today is slightly improved to 3.2 and Tmax- 103.2. Scr is stable at 0.92.   Plan: Meropenem 1 gm Q 8 hours Monitor renal function, clinical status and blood cultures from PCP office  Height: 5\' 2"  (157.5 cm) Weight: 169 lb (76.7 kg) IBW/kg (Calculated) : 50.1  Temp (24hrs), Avg:100.9 F (38.3 C), Min:98.9 F (37.2 C), Max:103.2 F (39.6 C)  Recent Labs  Lab 06/13/18 1752 06/13/18 1949 06/13/18 2201 06/14/18 0240  WBC 3.0*  --  2.8* 3.2*  CREATININE 0.91  --  0.85 0.92  LATICACIDVEN  --  1.18  --   --     Estimated Creatinine Clearance: 65.4 mL/min (by C-G formula based on SCr of 0.92 mg/dL).    Allergies  Allergen Reactions  . Penicillins Rash    Antimicrobials this admission: 11/8 Primaxin >11/8 11/8 Meropenem>>    Thank you for allowing pharmacy to be a part of this patient's care.  Della Goo, PharmD, BCPS  Infectious Diseases Clinical Pharmacist Phone: [phone] 06/14/2018 11:25 AM

## 2018-06-15 DIAGNOSIS — R5383 Other fatigue: Secondary | ICD-10-CM

## 2018-06-15 DIAGNOSIS — R110 Nausea: Secondary | ICD-10-CM

## 2018-06-15 LAB — COMPREHENSIVE METABOLIC PANEL
ALT: 81 U/L — ABNORMAL HIGH (ref 0–44)
AST: 120 U/L — ABNORMAL HIGH (ref 15–41)
Albumin: 2.9 g/dL — ABNORMAL LOW (ref 3.5–5.0)
Alkaline Phosphatase: 85 U/L (ref 38–126)
Anion gap: 7 (ref 5–15)
BUN: 5 mg/dL — ABNORMAL LOW (ref 6–20)
CO2: 23 mmol/L (ref 22–32)
Calcium: 8.8 mg/dL — ABNORMAL LOW (ref 8.9–10.3)
Chloride: 104 mmol/L (ref 98–111)
Creatinine, Ser: 0.84 mg/dL (ref 0.44–1.00)
GFR calc Af Amer: >60 mL/min (ref >60)
GFR calc non Af Amer: >60 mL/min (ref >60)
Glucose, Bld: 102 mg/dL — ABNORMAL HIGH (ref 70–99)
Potassium: 3.6 mmol/L (ref 3.5–5.1)
Sodium: 134 mmol/L — ABNORMAL LOW (ref 135–145)
Total Bilirubin: 0.5 mg/dL (ref 0.3–1.2)
Total Protein: 6.6 g/dL (ref 6.5–8.1)

## 2018-06-15 LAB — CBC
HCT: 35.7 % — ABNORMAL LOW (ref 36.0–46.0)
Hemoglobin: 11.8 g/dL — ABNORMAL LOW (ref 12.0–15.0)
MCH: 26.9 pg (ref 26.0–34.0)
MCHC: 33.1 g/dL (ref 30.0–36.0)
MCV: 81.5 fL (ref 80.0–100.0)
Platelets: 152 10*3/uL (ref 150–400)
RBC: 4.38 MIL/uL (ref 3.87–5.11)
RDW: 14.6 % (ref 11.5–15.5)
WBC: 2.9 10*3/uL — ABNORMAL LOW (ref 4.0–10.5)
nRBC: 0 % (ref 0.0–0.2)

## 2018-06-15 MED ORDER — LACTATED RINGERS IV SOLN
INTRAVENOUS | Status: DC
  Administered 2018-06-15 – 2018-06-16 (×3): via INTRAVENOUS

## 2018-06-15 NOTE — Progress Notes (Signed)
Waiting for ID of GNR.  Cultures here with Enterobacteriaceae but no Enterobacter, E coli or Klebsiella.  This may be a false ID.  Will wait for culture results.  Continue meropenem.   Gardiner Barefoot, MD

## 2018-06-15 NOTE — Progress Notes (Signed)
Subjective: Patient was seen and evaluated at bedside on morning rounds. She has some nausea and 1 episode of loose stool. She denies any blood in it. Also says she feels exhausted. Had fever at 100.5 this AM but resolved. Does not have any abdominal pain.  No vomiting.  We explained the plan of care for her for continuing current antibiotic and follow-up with her blood culture. She agrees with our plan.   Objective:  Vital signs in last 24 hours: Vitals:   06/14/18 1856 06/14/18 2214 06/15/18 0033 06/15/18 0511  BP:   133/90 123/83  Pulse:   91 81  Resp:   20 20  Temp: (!) 103.1 F (39.5 C) 98.5 F (36.9 C) (!) 100.5 F (38.1 C) 98.6 F (37 C)  TempSrc: Oral Oral Oral Oral  SpO2:   98% 98%  Weight:    75.8 kg  Height:       Physical Exam  Constitutional: She is oriented to person, place, and time. She appears well-developed and well-nourished. She appears ill. No distress.  HENT:  Head: Normocephalic and atraumatic.  Eyes: Pupils are equal, round, and reactive to light. EOM are normal.  Cardiovascular: Normal rate, regular rhythm, normal heart sounds and intact distal pulses.  No murmur heard. Pulmonary/Chest: Effort normal and breath sounds normal. She has no wheezes. She has no rales.  Abdominal: Soft. Bowel sounds are normal. There is no tenderness.  Musculoskeletal: Normal range of motion. She exhibits no edema or tenderness.  Neurological: She is alert and oriented to person, place, and time.  Skin: Skin is warm. Capillary refill takes less than 2 seconds.  Psychiatric: She has a normal mood and affect. Her behavior is normal. Judgment and thought content normal.   CBC Latest Ref Rng & Units 06/15/2018 06/14/2018 06/13/2018  WBC 4.0 - 10.5 K/uL 2.9(L) 3.2(L) 2.8(L)  Hemoglobin 12.0 - 15.0 g/dL 11.8(L) 11.2(L) 11.5(L)  Hematocrit 36.0 - 46.0 % 35.7(L) 35.2(L) 36.6  Platelets 150 - 400 K/uL 152 111(L) 134(L)   CMP Latest Ref Rng & Units 06/15/2018 06/14/2018 06/13/2018    Glucose 70 - 99 mg/dL 102(H) 114(H) -  BUN 6 - 20 mg/dL 5(L) 8 -  Creatinine 0.44 - 1.00 mg/dL 0.84 0.92 0.85  Sodium 135 - 145 mmol/L 134(L) 136 -  Potassium 3.5 - 5.1 mmol/L 3.6 3.1(L) -  Chloride 98 - 111 mmol/L 104 110 -  CO2 22 - 32 mmol/L 23 18(L) -  Calcium 8.9 - 10.3 mg/dL 8.8(L) 8.0(L) -  Total Protein 6.5 - 8.1 g/dL 6.6 6.1(L) -  Total Bilirubin 0.3 - 1.2 mg/dL 0.5 0.9 -  Alkaline Phos 38 - 126 U/L 85 63 -  AST 15 - 41 U/L 120(H) 94(H) -  ALT 0 - 44 U/L 81(H) 63(H) -   Assessment/Plan:  Principal Problem:   Gram-negative bacteremia Active Problems:   Sepsis (HCC)   Pancytopenia (HCC)   Non-traumatic rhabdomyolysis  Ms. Novak is a 57 year old female presented with fever and chills, and reported gram-negative bacteremia on blood culture obtained in outpatient clinic.  She had a recent travel to [place].  Sepsis with Gr - rod bacteriemia: Arrival: Patient had fever, leukopenia, mild thrombocytopenia. ESR is 64 and CRP of 17.5 on arrival transaminates slightly worsened and has elevated CK, likely in setting of infectious disease  Gram-negative bacteremia more concerning for typhoid.  BCID showed Enterobacteriaceae.  Was not resistant to carbapenem  -Continue meropenem -Follow-up identification of pathogen and sensitivity before narrowing -Still no  results from [facility] for blood culture.  Will follow up -C/w Tylenol PRN for pain and Zofran PRN -CMP daily -CBC daily -ID is on board. Appreciate their recommendation  Hypothyroidism: Stable  continue levothyroxine  Dispo: Anticipated discharge in approximately 3-4 days based on clinical improvement and further blood culture results Dewayne Hatch, MD 06/15/2018, 5:55 AM Pager: [phone]

## 2018-06-15 NOTE — Progress Notes (Signed)
Update provided to pt and pt son at bedside  MD Helberg aware

## 2018-06-16 DIAGNOSIS — A0100 Typhoid fever, unspecified: Secondary | ICD-10-CM

## 2018-06-16 DIAGNOSIS — Z1624 Resistance to multiple antibiotics: Secondary | ICD-10-CM

## 2018-06-16 DIAGNOSIS — Z1611 Resistance to penicillins: Secondary | ICD-10-CM

## 2018-06-16 DIAGNOSIS — R740 Nonspecific elevation of levels of transaminase and lactic acid dehydrogenase [LDH]: Secondary | ICD-10-CM

## 2018-06-16 LAB — CBC
HEMATOCRIT: 34.5 % — AB (ref 36.0–46.0)
HEMOGLOBIN: 11.7 g/dL — AB (ref 12.0–15.0)
MCH: 27.3 pg (ref 26.0–34.0)
MCHC: 33.9 g/dL (ref 30.0–36.0)
MCV: 80.4 fL (ref 80.0–100.0)
NRBC: 0 % (ref 0.0–0.2)
Platelets: 181 10*3/uL (ref 150–400)
RBC: 4.29 MIL/uL (ref 3.87–5.11)
RDW: 14.3 % (ref 11.5–15.5)
WBC: 3.1 10*3/uL — AB (ref 4.0–10.5)

## 2018-06-16 LAB — COMPREHENSIVE METABOLIC PANEL
ALBUMIN: 2.7 g/dL — AB (ref 3.5–5.0)
ALK PHOS: 84 U/L (ref 38–126)
ALT: 95 U/L — ABNORMAL HIGH (ref 0–44)
AST: 167 U/L — AB (ref 15–41)
Anion gap: 7 (ref 5–15)
BILIRUBIN TOTAL: 0.8 mg/dL (ref 0.3–1.2)
BUN: 6 mg/dL (ref 6–20)
CO2: 25 mmol/L (ref 22–32)
Calcium: 8.6 mg/dL — ABNORMAL LOW (ref 8.9–10.3)
Chloride: 102 mmol/L (ref 98–111)
Creatinine, Ser: 0.79 mg/dL (ref 0.44–1.00)
GFR calc Af Amer: >60 mL/min (ref >60)
Glucose, Bld: 113 mg/dL — ABNORMAL HIGH (ref 70–99)
POTASSIUM: 3.5 mmol/L (ref 3.5–5.1)
Sodium: 134 mmol/L — ABNORMAL LOW (ref 135–145)
Total Protein: 6.2 g/dL — ABNORMAL LOW (ref 6.5–8.1)

## 2018-06-16 LAB — CK: CK TOTAL: 541 U/L — AB (ref 38–234)

## 2018-06-16 NOTE — Progress Notes (Addendum)
Pt refusing new IV, slowed antibiotics down, pt tolerating well  No redness or hardness at the site  Unit based pharmacist aware and stated okay to hang at a rate of 100 versus 279ml/hr Educated pt on signs of infiltration and when to call RN

## 2018-06-16 NOTE — Progress Notes (Addendum)
   Subjective: Patient is doing a little better today. She was having a left occipital/temporal headache yesterday that has improved. She is tolerating PO intake and been ambulating as much as possible. She does endorse generalized weakness but denies arthralgias and myalgias. We discussed that her blood cultures returned positive for Salmonella species. We will coordinate with infectious disease. All questions and concerns addressed.   Objective: Vital signs in last 24 hours: Vitals:   06/15/18 2004 06/15/18 2300 06/16/18 0539 06/16/18 0824  BP: 123/77  127/79 131/83  Pulse: 88  82 81  Resp: 20  20 16   Temp: 98.4 F (36.9 C) 98.6 F (37 C) 98.8 F (37.1 C) 98.8 F (37.1 C)  TempSrc: Oral Oral Oral Oral  SpO2: 98%  97% 99%  Weight:   75.5 kg   Height:       General: Well nourished female in no acute distress Pulm: Good air movement with no wheezing or crackles  CV: RRR, no murmurs, no rubs  Abdomen: Soft, non-distended, no tenderness to palpation  Extremities: No LE edema   Assessment/Plan:  Joyce Morgan is a 57 y.o female with recent travels to [place] who presented to the ED with fevers, chills, diarrhea, and gram negative bacteremia per her PCP. She was empirically started on Meropenem and admitted to the hospital for further evaluations.   Salmonella Bacteremia - Patient is feeling better overall  - Fever curve improving  - Blood cultures positive for Salmonella species with resistance to ampicillin, trimeth/sulfa, and intermediate resistance to levofloxacin.  - Will continue Meropenem. Patient will need 10-14 days of antibiotics. Will coordinate with ID.   Transaminitis  - AST and ALT continued to be elevated, likely secondary to muscle injury as opposed to liver injury  - Will get CK to ensure it is not up trending.   Dispo: Anticipated discharge in approximately 1-2 day(s) once outpatient antibiotic regimen is determined.   Levora Dredge, MD 06/16/2018, 9:01 AM

## 2018-06-16 NOTE — Progress Notes (Signed)
Per MEWS score of 3, pt temperature treated with PO tylenol, pt here for rule out typhoid fever, pt on IV antibiotics as well  Will continue to monitor

## 2018-06-16 NOTE — Progress Notes (Signed)
[facility]   Reason for visit: Follow up on Typhoid fever  Interval History: blood cultures here also with Salmonella.  No species identified but certainly clinical course c/w typhoid/paratyphoid.  WBC up a bit to 3.1. Fever curve trending down.      Physical Exam: Constitutional:  Vitals:   06/16/18 0824 06/16/18 1142  BP: 131/83 121/75  Pulse: 81 88  Resp: 16 16  Temp: 98.8 F (37.1 C) 98.9 F (37.2 C)  SpO2: 99% 91%   patient appears in NAD Respiratory: Normal respiratory effort; CTA B Cardiovascular: RRR GI: soft, nt, nd  Review of Systems: Constitutional: negative for malaise Gastrointestinal: negative for nausea  Lab Results  Component Value Date   WBC 3.1 (L) 06/16/2018   HGB 11.7 (L) 06/16/2018   HCT 34.5 (L) 06/16/2018   MCV 80.4 06/16/2018   PLT 181 06/16/2018    Lab Results  Component Value Date   CREATININE 0.79 06/16/2018   BUN 6 06/16/2018   NA 134 (L) 06/16/2018   K 3.5 06/16/2018   CL 102 06/16/2018   CO2 25 06/16/2018    Lab Results  Component Value Date   ALT 95 (H) 06/16/2018   AST 167 (H) 06/16/2018   ALKPHOS 84 06/16/2018     Microbiology: Recent Results (from the past 240 hour(s))  Culture, blood (routine x 2)     Status: Abnormal (Preliminary result)   Collection Time: 06/13/18  7:33 PM  Result Value Ref Range Status   Specimen Description BLOOD BLOOD LEFT HAND  Final   Special Requests   Final    BOTTLES DRAWN AEROBIC AND ANAEROBIC Blood Culture results may not be optimal due to an inadequate volume of blood received in culture bottles   Culture  Setup Time   Final    GRAM NEGATIVE RODS IN BOTH AEROBIC AND ANAEROBIC BOTTLES CRITICAL RESULT CALLED TO, READ BACK BY AND VERIFIED WITH: PHARMD D PANG [id number]    Culture (A)  Final    SALMONELLA SPECIES SUSCEPTIBILITIES PERFORMED ON PREVIOUS CULTURE WITHIN THE LAST 5 DAYS. HEALTH DEPARTMENT NOTIFIED SENT TO STATE LAB FOR  CONFIRMATION Performed at [facility], [address] [place], [place] [address]    Report Status PENDING  Incomplete  Culture, blood (routine x 2)     Status: Abnormal (Preliminary result)   Collection Time: 06/13/18  7:40 PM  Result Value Ref Range Status   Specimen Description BLOOD RIGHT ANTECUBITAL  Final   Special Requests   Final    BOTTLES DRAWN AEROBIC AND ANAEROBIC Blood Culture results may not be optimal due to an inadequate volume of blood received in culture bottles   Culture  Setup Time   Final    GRAM NEGATIVE RODS ANAEROBIC BOTTLE ONLY CRITICAL RESULT CALLED TO, READ BACK BY AND VERIFIED WITH: PHARMD P DANG 06/14/18 AT 1751 BY CM    Culture (A)  Final    SALMONELLA SPECIES HEALTH DEPARTMENT NOTIFIED SENT TO STATE LAB FOR CONFIRMATION    Report Status PENDING  Incomplete   Organism ID, Bacteria SALMONELLA SPECIES  Final      Susceptibility   Salmonella species - MIC*    AMPICILLIN >=32 RESISTANT Resistant     LEVOFLOXACIN 4 INTERMEDIATE Intermediate     TRIMETH/SULFA >=320 RESISTANT Resistant     ERTAPENEM SENSITIVE Sensitive     IMIPENEM SENSITIVE Sensitive     CEFTRIAXONE Value in next row Resistant  RESISTANTPerformed at [facility], [address] [place], [place] [address]    * SALMONELLA SPECIES  Blood Culture ID Panel (Reflexed)     Status: Abnormal   Collection Time: 06/13/18  7:40 PM  Result Value Ref Range Status   Enterococcus species NONE DETECTED (A) NOT DETECTED Final   Vancomycin resistance NONE DETECTED (A) NOT DETECTED Final   Listeria monocytogenes NONE DETECTED (A) NOT DETECTED Final   Staphylococcus species NONE DETECTED (A) NOT DETECTED Final   Staphylococcus aureus (BCID) NONE DETECTED (A) NOT DETECTED Final   Methicillin resistance NONE DETECTED (A) NOT DETECTED Final   Streptococcus species NONE DETECTED (A) NOT DETECTED Final   Streptococcus agalactiae NONE DETECTED (A) NOT DETECTED Final   Streptococcus pneumoniae  NONE DETECTED (A) NOT DETECTED Final   Streptococcus pyogenes NONE DETECTED (A) NOT DETECTED Final   Acinetobacter baumannii NONE DETECTED (A) NOT DETECTED Final   Enterobacteriaceae species DETECTED (A) NOT DETECTED Final    Comment: CRITICAL RESULT CALLED TO, READ BACK BY AND VERIFIED WITH: PHARMD P DANG 06/14/18 AT 1751 BY CM    Enterobacter cloacae complex NONE DETECTED (A) NOT DETECTED Final   Escherichia coli NONE DETECTED (A) NOT DETECTED Final   Klebsiella oxytoca NONE DETECTED (A) NOT DETECTED Final   Klebsiella pneumoniae NONE DETECTED (A) NOT DETECTED Final   Proteus species NONE DETECTED (A) NOT DETECTED Final   Serratia marcescens NONE DETECTED (A) NOT DETECTED Final   Carbapenem resistance NONE DETECTED (A) NOT DETECTED Final   Haemophilus influenzae NONE DETECTED (A) NOT DETECTED Final   Neisseria meningitidis NONE DETECTED (A) NOT DETECTED Final   Pseudomonas aeruginosa NONE DETECTED (A) NOT DETECTED Final   Candida albicans NONE DETECTED (A) NOT DETECTED Final   Candida glabrata NONE DETECTED (A) NOT DETECTED Final   Candida krusei NONE DETECTED (A) NOT DETECTED Final   Candida parapsilosis NONE DETECTED (A) NOT DETECTED Final   Candida tropicalis NONE DETECTED (A) NOT DETECTED Final    Comment: Performed at [facility], [address], [place], [place] [address]    Impression/Plan:  1. Typhoid fever - sensitivities noted and uncovered the rest.  Really on carbapenem sensitive, ceftriaxone and oral options resistant.   I would  Treat for 7 days after discharge. Will need to use carbapenem such as ertapenem (which is once a day). I will repeat blood cultures  2. Access - for GNR, ok from ID standpoint to place picc line tomorrow or Tuesday and discharge.  3.  transaminitis - noted and typical. CK improved.  Continue hydration.    I will arrange follow up after treatment completion.

## 2018-06-17 ENCOUNTER — Inpatient Hospital Stay: Payer: Self-pay

## 2018-06-17 DIAGNOSIS — Z79899 Other long term (current) drug therapy: Secondary | ICD-10-CM

## 2018-06-17 DIAGNOSIS — A028 Other specified salmonella infections: Secondary | ICD-10-CM

## 2018-06-17 DIAGNOSIS — A029 Salmonella infection, unspecified: Secondary | ICD-10-CM

## 2018-06-17 LAB — BASIC METABOLIC PANEL
Anion gap: 8 (ref 5–15)
BUN: 6 mg/dL (ref 6–20)
CALCIUM: 8.8 mg/dL — AB (ref 8.9–10.3)
CO2: 26 mmol/L (ref 22–32)
Chloride: 99 mmol/L (ref 98–111)
Creatinine, Ser: 0.81 mg/dL (ref 0.44–1.00)
GFR calc Af Amer: >60 mL/min (ref >60)
GFR calc non Af Amer: >60 mL/min (ref >60)
Glucose, Bld: 115 mg/dL — ABNORMAL HIGH (ref 70–99)
Potassium: 3.5 mmol/L (ref 3.5–5.1)
Sodium: 133 mmol/L — ABNORMAL LOW (ref 135–145)

## 2018-06-17 LAB — PARASITE EXAM, BLOOD

## 2018-06-17 MED ORDER — SUMATRIPTAN SUCCINATE 25 MG PO TABS
25.0000 mg | ORAL_TABLET | Freq: Every day | ORAL | Status: DC | PRN
  Administered 2018-06-18: 25 mg via ORAL
  Filled 2018-06-17 (×2): qty 1

## 2018-06-17 NOTE — Progress Notes (Signed)
[facility]  [facility] will provide home infusion pharmacy services and home health [facility]) for home IVABX for pt at [place]. 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  If patient discharges after hours, please call [phone].   Joyce Morgan 06/17/2018, 5:30 PM

## 2018-06-17 NOTE — Progress Notes (Signed)
Pharmacy Antibiotic Note  Joyce Morgan is a 57 y.o. female admitted on 06/13/2018 with Salmonella bacteremia  Pharmacy has been consulted for meropenem dosing. Salmonella is multi-drug resistant and only susceptible to carbapenems. WBC count is improved and Tmax 102.5. Renal function is stable.    Plan: Continue Meropenem 1 gm Q 8 hours Ertapenem 1 gm Q 24 hours X 7 days at discharge Will enter OPAT orders  Height: 5\' 2"  (157.5 cm) Weight: 165 lb 12.8 oz (75.2 kg) IBW/kg (Calculated) : 50.1  Temp (24hrs), Avg:99.6 F (37.6 C), Min:98.4 F (36.9 C), Max:102.5 F (39.2 C)  Recent Labs  Lab 06/13/18 1752 06/13/18 1949 06/13/18 2201 06/14/18 0240 06/15/18 0508 06/16/18 0542 06/17/18 0743  WBC 3.0*  --  2.8* 3.2* 2.9* 3.1*  --   CREATININE 0.91  --  0.85 0.92 0.84 0.79 0.81  LATICACIDVEN  --  1.18  --   --   --   --   --     Estimated Creatinine Clearance: 73.6 mL/min (by C-G formula based on SCr of 0.81 mg/dL).    Allergies  Allergen Reactions  . Penicillins Rash    Antimicrobials this admission: 11/8 Primaxin >11/8 11/8 Meropenem>>    Thank you for allowing pharmacy to be a part of this patient's care.  Della Goo, PharmD, BCPS  Infectious Diseases Clinical Pharmacist Phone: [phone] 06/17/2018 1:35 PM

## 2018-06-17 NOTE — Progress Notes (Addendum)
   Subjective: Patient was seen and evaluated at bedside on morning rounds. No acute events overnight. No more nausea. She reports some headache yesterday but nothing today. She says she toke Sumatriptan at home for headache. Also denies any diarrhea or abdominal pain. Has some generalized weakness. We talked about continuing IV antibiotic and plan for placing PICC line so she continue IV treatment when goes home. She agrees with the plan.  Objective:  Vital signs in last 24 hours: Vitals:   06/17/18 0557 06/17/18 0700 06/17/18 0729 06/17/18 1307  BP: 111/77  133/83 115/83  Pulse: (!) 105  84 98  Resp: 18  16 18   Temp: 99.9 F (37.7 C) 99.5 F (37.5 C) 99.3 F (37.4 C) 98.5 F (36.9 C)  TempSrc: Oral Oral Oral Oral  SpO2: 99%  98% 99%  Weight: 75.2 kg     Height:       Physical Exam  Constitutional: She is oriented to person, place, and time. She appears well-developed and well-nourished. No distress.  HENT:  Head: Normocephalic and atraumatic.  Eyes: EOM are normal.  Cardiovascular: Normal rate, regular rhythm, normal heart sounds and intact distal pulses.  No murmur heard. Pulmonary/Chest: Effort normal. No respiratory distress. She has no wheezes. She has no rales.  Abdominal: Soft. Bowel sounds are normal. She exhibits no distension. There is no tenderness.  Musculoskeletal: Normal range of motion. She exhibits no edema or tenderness.  Neurological: She is alert and oriented to person, place, and time.  Skin: Skin is warm and dry.  Psychiatric: She has a normal mood and affect. Her behavior is normal. Judgment normal.    Assessment/Plan:  Principal Problem:   Salmonella bacteremia Active Problems:   Sepsis (HCC)   Pancytopenia (HCC)   Non-traumatic rhabdomyolysis   Joyce Morgan is a 57 y.o female with recent travels to [place] who presented to the ED with fevers, chills, diarrhea, and gram negative bacteremia per her PCP. She was empirically started on Meropenem  and admitted to the hospital for further evaluations.   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 Carbapenem class as the only treatment option. How ever she has had fluctuating symptoms since admission, but overrally improved today.  -C/W Meropenem and will need to continue that to complete 10-14 days -ID, Dr. Luciana Axe is on board. Recommended PICC line tomorrow -Had some fever yesterday that resolved since yesterday evening. Will monitor patient's temperature today and may discharge tomorrow if remain afebrile and stable  Transaminates: CK improved but still has transaminates. Likely in setting of infection and muscle injury.  -Repeat CMP before discharge  Headache with Hx of migraine. On Sumatriptan at home - Sumatriptan 25 mg QD PRN  Dispo: Anticipated discharge in approximately 1-2 days, (after PICC placement and if remains afebrile and clinically stable)  Chevis Pretty, MD 06/17/2018, 1:19 PM Pager: [phone]

## 2018-06-17 NOTE — Progress Notes (Signed)
[facility]  Date of Admission:  06/13/2018     Principal Problem:   Salmonella bacteremia Active Problems:   Sepsis (HCC)   Pancytopenia (HCC)   Non-traumatic rhabdomyolysis   . enoxaparin (LOVENOX) injection  40 mg Subcutaneous Daily  . levothyroxine  75 mcg Oral Q0600  . sodium chloride flush  3 mL Intravenous Q12H    SUBJECTIVE: Mrs.Meiners is a 57 yo F admit for typhoid fever. She was examined and evaluated at bedside this AM with family present. She was febrile yesterday afternoon at 102.61F but no other febrile event after that. Repeat blood cultures were collected at that time but shows no growth so far. This morning she is endorsing some weakness but overall states her symptoms have significantly improved. Denies any nausea, vomiting, diarrhea, abdominal pain.   Review of Systems: Review of Systems  Constitutional: Positive for fever. Negative for chills and malaise/fatigue.  Respiratory: Negative for cough, sputum production and shortness of breath.   Cardiovascular: Negative for chest pain and palpitations.  Gastrointestinal: Negative for constipation, diarrhea, nausea and vomiting.  Skin: Negative for rash.  Neurological: Positive for headaches.    Past Medical History:  Diagnosis Date  . Asthma   . Hypothyroidism     Social History   Tobacco Use  . Smoking status: Never Smoker  . Smokeless tobacco: Never Used  Substance Use Topics  . Alcohol use: Never    Frequency: Never  . Drug use: Never    History reviewed. No pertinent family history. Allergies  Allergen Reactions  . Penicillins Rash    OBJECTIVE: Vitals:   06/17/18 0034 06/17/18 0557 06/17/18 0700 06/17/18 0729  BP: 129/81 111/77  133/83  Pulse: 84 (!) 105  84  Resp: 18 18  16   Temp: 99.3 F (37.4 C) 99.9 F (37.7 C) 99.5 F (37.5 C) 99.3 F (37.4 C)  TempSrc: Oral Oral Oral Oral  SpO2: 100% 99%  98%  Weight:  75.2 kg    Height:       Body  mass index is 30.33 kg/m.  Physical Exam  Constitutional: She is oriented to person, place, and time. She appears well-developed and well-nourished. No distress.  Eyes: Pupils are equal, round, and reactive to light. Conjunctivae and EOM are normal.  Neck: Normal range of motion. Neck supple.  Cardiovascular: Normal rate, regular rhythm, normal heart sounds and intact distal pulses.  Pulmonary/Chest: Effort normal. No respiratory distress. She has no wheezes. She has no rales.  Abdominal: Soft. Bowel sounds are normal. There is no tenderness. There is no rebound and no guarding.  Musculoskeletal: Normal range of motion.  Lymphadenopathy:    She has no cervical adenopathy.  Neurological: She is alert and oriented to person, place, and time.  Skin: Skin is warm and dry.    Lab Results Lab Results  Component Value Date   WBC 3.1 (L) 06/16/2018   HGB 11.7 (L) 06/16/2018   HCT 34.5 (L) 06/16/2018   MCV 80.4 06/16/2018   PLT 181 06/16/2018    Lab Results  Component Value Date   CREATININE 0.81 06/17/2018   BUN 6 06/17/2018   NA 133 (L) 06/17/2018   K 3.5 06/17/2018   CL 99 06/17/2018   CO2 26 06/17/2018    Lab Results  Component Value Date   ALT 95 (H) 06/16/2018   AST 167 (H) 06/16/2018   ALKPHOS 84 06/16/2018   BILITOT 0.8 06/16/2018  Microbiology: Recent Results (from the past 240 hour(s))  Culture, blood (routine x 2)     Status: Abnormal (Preliminary result)   Collection Time: 06/13/18  7:33 PM  Result Value Ref Range Status   Specimen Description BLOOD BLOOD LEFT HAND  Final   Special Requests   Final    BOTTLES DRAWN AEROBIC AND ANAEROBIC Blood Culture results may not be optimal due to an inadequate volume of blood received in culture bottles   Culture  Setup Time   Final    GRAM NEGATIVE RODS IN BOTH AEROBIC AND ANAEROBIC BOTTLES CRITICAL RESULT CALLED TO, READ BACK BY AND VERIFIED WITH: PHARMD D PANG [id number]    Culture (A)  Final    SALMONELLA  SPECIES SUSCEPTIBILITIES PERFORMED ON PREVIOUS CULTURE WITHIN THE LAST 5 DAYS. HEALTH DEPARTMENT NOTIFIED SENT TO STATE [facility] FOR CONFIRMATION Performed at [facility], [address], [place], [place] [address]    Report Status PENDING  Incomplete  Culture, blood (routine x 2)     Status: Abnormal (Preliminary result)   Collection Time: 06/13/18  7:40 PM  Result Value Ref Range Status   Specimen Description BLOOD RIGHT ANTECUBITAL  Final   Special Requests   Final    BOTTLES DRAWN AEROBIC AND ANAEROBIC Blood Culture results may not be optimal due to an inadequate volume of blood received in culture bottles   Culture  Setup Time   Final    GRAM NEGATIVE RODS ANAEROBIC BOTTLE ONLY CRITICAL RESULT CALLED TO, READ BACK BY AND VERIFIED WITH: PHARMD P DANG 06/14/18 AT 1751 BY CM    Culture (A)  Final    SALMONELLA SPECIES HEALTH DEPARTMENT NOTIFIED SENT TO STATE [facility] FOR CONFIRMATION    Report Status PENDING  Incomplete   Organism ID, Bacteria SALMONELLA SPECIES  Final      Susceptibility   Salmonella species - MIC*    AMPICILLIN >=32 RESISTANT Resistant     LEVOFLOXACIN 4 INTERMEDIATE Intermediate     TRIMETH/SULFA >=320 RESISTANT Resistant     ERTAPENEM SENSITIVE Sensitive     IMIPENEM SENSITIVE Sensitive     CEFTRIAXONE Value in next row Resistant      RESISTANTPerformed at [facility], [address], [place], [place] [address]    * SALMONELLA SPECIES  Blood Culture ID Panel (Reflexed)     Status: Abnormal   Collection Time: 06/13/18  7:40 PM  Result Value Ref Range Status   Enterococcus species NONE DETECTED (A) NOT DETECTED Final   Vancomycin resistance NONE DETECTED (A) NOT DETECTED Final   Listeria monocytogenes NONE DETECTED (A) NOT DETECTED Final   Staphylococcus species NONE DETECTED (A) NOT DETECTED Final   Staphylococcus aureus (BCID) NONE DETECTED (A) NOT DETECTED Final   Methicillin resistance NONE DETECTED (A) NOT DETECTED Final   Streptococcus  species NONE DETECTED (A) NOT DETECTED Final   Streptococcus agalactiae NONE DETECTED (A) NOT DETECTED Final   Streptococcus pneumoniae NONE DETECTED (A) NOT DETECTED Final   Streptococcus pyogenes NONE DETECTED (A) NOT DETECTED Final   Acinetobacter baumannii NONE DETECTED (A) NOT DETECTED Final   Enterobacteriaceae species DETECTED (A) NOT DETECTED Final    Comment: CRITICAL RESULT CALLED TO, READ BACK BY AND VERIFIED WITH: PHARMD P DANG 06/14/18 AT 1751 BY CM    Enterobacter cloacae complex NONE DETECTED (A) NOT DETECTED Final   Escherichia coli NONE DETECTED (A) NOT DETECTED Final   Klebsiella oxytoca NONE DETECTED (A) NOT DETECTED Final   Klebsiella  pneumoniae NONE DETECTED (A) NOT DETECTED Final   Proteus species NONE DETECTED (A) NOT DETECTED Final   Serratia marcescens NONE DETECTED (A) NOT DETECTED Final   Carbapenem resistance NONE DETECTED (A) NOT DETECTED Final   Haemophilus influenzae NONE DETECTED (A) NOT DETECTED Final   Neisseria meningitidis NONE DETECTED (A) NOT DETECTED Final   Pseudomonas aeruginosa NONE DETECTED (A) NOT DETECTED Final   Candida albicans NONE DETECTED (A) NOT DETECTED Final   Candida glabrata NONE DETECTED (A) NOT DETECTED Final   Candida krusei NONE DETECTED (A) NOT DETECTED Final   Candida parapsilosis NONE DETECTED (A) NOT DETECTED Final   Candida tropicalis NONE DETECTED (A) NOT DETECTED Final    Comment: Performed at [facility], [address] [place], [place] [address]  Culture, blood (routine x 2)     Status: None (Preliminary result)   Collection Time: 06/16/18  3:03 PM  Result Value Ref Range Status   Specimen Description BLOOD LEFT ANTECUBITAL  Final   Special Requests   Final    BOTTLES DRAWN AEROBIC AND ANAEROBIC Blood Culture adequate volume   Culture   Final    NO GROWTH < 24 HOURS Performed at [facility], [address] [place], [place] [address]    Report Status PENDING  Incomplete  Culture, blood (routine x 2)      Status: None (Preliminary result)   Collection Time: 06/16/18  3:03 PM  Result Value Ref Range Status   Specimen Description BLOOD RIGHT ANTECUBITAL  Final   Special Requests   Final    BOTTLES DRAWN AEROBIC AND ANAEROBIC Blood Culture adequate volume   Culture   Final    NO GROWTH < 24 HOURS Performed at [facility], [address] [place], [place] [address]    Report Status PENDING  Incomplete     ASSESSMENT: Mr.Gucciardo is a 57 yo F w/ PMH admit for Typhoid fever after recent travel to [place]. Symptoms appear to be improving. No other febrile event after yesterday afternoon. Sensitivities show multi-drug resistant salmonella sensitive to ertapenem. Unfortunately oral options not available due to drug resistance and she will need a PICC line prior to discharge for 7 day treatment post-discharge.  PLAN: 1. Typhoid fever with carbapenem sensitivity - Recommend 7 day course of ertapenem on discharge for easier dosing - F/u blood cultures - PICC line prior to discharge (order already placed by primary) - F/u at outpatient ID clinic after 7 day therapy  Theotis Barrio, MD 06/17/2018, 10:58 AM

## 2018-06-17 NOTE — Progress Notes (Signed)
PHARMACY CONSULT NOTE FOR:  OUTPATIENT  PARENTERAL ANTIBIOTIC THERAPY (OPAT)  Indication: Salmonella typhi bacteremia Regimen: Ertapenem 1 gm every 24 hours End date: 06/25/18  IV antibiotic discharge orders are pended. To discharging provider:  please sign these orders via discharge navigator,  Select New Orders & click on the button choice - Manage This Unsigned Work.     Thank you for allowing pharmacy to be a part of this patient's care.  Della Goo, PharmD, BCPS  Infectious Diseases Clinical Pharmacist  Phone: [phone] 06/17/2018, 1:45 PM

## 2018-06-17 NOTE — Progress Notes (Signed)
Contacted Dr. Luciana Axe concerning placing PICC today. He indicated to wait until tomorrow.

## 2018-06-18 DIAGNOSIS — Z95828 Presence of other vascular implants and grafts: Secondary | ICD-10-CM

## 2018-06-18 LAB — BASIC METABOLIC PANEL
Anion gap: 8 (ref 5–15)
BUN: 11 mg/dL (ref 6–20)
CO2: 23 mmol/L (ref 22–32)
Calcium: 8.2 mg/dL — ABNORMAL LOW (ref 8.9–10.3)
Chloride: 101 mmol/L (ref 98–111)
Creatinine, Ser: 0.7 mg/dL (ref 0.44–1.00)
GFR calc Af Amer: >60 mL/min (ref >60)
GFR calc non Af Amer: >60 mL/min (ref >60)
Glucose, Bld: 121 mg/dL — ABNORMAL HIGH (ref 70–99)
Potassium: 3.4 mmol/L — ABNORMAL LOW (ref 3.5–5.1)
Sodium: 132 mmol/L — ABNORMAL LOW (ref 135–145)

## 2018-06-18 LAB — CBC
HCT: 35.8 % — ABNORMAL LOW (ref 36.0–46.0)
Hemoglobin: 11.4 g/dL — ABNORMAL LOW (ref 12.0–15.0)
MCH: 26.1 pg (ref 26.0–34.0)
MCHC: 31.8 g/dL (ref 30.0–36.0)
MCV: 81.9 fL (ref 80.0–100.0)
Platelets: 280 10*3/uL (ref 150–400)
RBC: 4.37 MIL/uL (ref 3.87–5.11)
RDW: 14.6 % (ref 11.5–15.5)
WBC: 4.6 10*3/uL (ref 4.0–10.5)
nRBC: 0 % (ref 0.0–0.2)

## 2018-06-18 MED ORDER — SODIUM CHLORIDE 0.9 % IV SOLN
1.0000 g | INTRAVENOUS | Status: DC
  Administered 2018-06-18: 1000 mg via INTRAVENOUS
  Filled 2018-06-18: qty 1

## 2018-06-18 MED ORDER — SODIUM CHLORIDE 0.9% FLUSH
10.0000 mL | INTRAVENOUS | Status: DC | PRN
  Administered 2018-06-18: 10 mL
  Filled 2018-06-18: qty 40

## 2018-06-18 MED ORDER — HEPARIN SOD (PORK) LOCK FLUSH 100 UNIT/ML IV SOLN
250.0000 [IU] | INTRAVENOUS | Status: AC | PRN
  Administered 2018-06-18: 250 [IU]

## 2018-06-18 MED ORDER — ACETAMINOPHEN 325 MG PO TABS: 650.0000 mg | ORAL_TABLET | Freq: Three times a day (TID) | ORAL | 0 refills | Status: AC | PRN

## 2018-06-18 MED ORDER — ERTAPENEM IV (FOR PTA / DISCHARGE USE ONLY): 1.0000 g | INTRAVENOUS | 0 refills | Status: AC

## 2018-06-18 NOTE — Discharge Summary (Addendum)
Name: Joyce Morgan MRN: [id number] DOB: 11-16-1960 57 y.o. PCP: Joyce Number, MD  Date of Admission: 06/13/2018  4:32 PM Date of Discharge: 11/12/201911/07/2018 Attending Physician: Oda Kilts, MD  Discharge Diagnosis: 1.Principal Problem:   Salmonella bacteremia Active Problems:   Pancytopenia ([place])   Non-traumatic rhabdomyolysis   Discharge Medications: Allergies as of 06/18/2018      Reactions   Penicillins Rash      Medication List    TAKE these medications   acetaminophen 325 MG tablet Commonly known as:  TYLENOL Take 2 tablets (650 mg total) by mouth every 8 (eight) hours as needed for moderate pain or fever (or Fever >/= 101).   cetirizine 10 MG tablet Commonly known as:  ZYRTEC Take 10 mg by mouth daily as needed for allergies.   fexofenadine 180 MG tablet Commonly known as:  ALLEGRA Take 180 mg by mouth daily as needed for allergies or rhinitis.   levothyroxine 75 MCG tablet Commonly known as:  SYNTHROID, LEVOTHROID Take 75 mcg by mouth daily before breakfast.   VITAMIN B-12 IJ Inject 1,000 Units as directed every 14 (fourteen) days.   Vitamin D (Ergocalciferol) 1.25 MG (50000 UT) Caps capsule Commonly known as:  DRISDOL Take 50,000 Units by mouth every 7 (seven) days.     ASK your doctor about these medications   ertapenem  IVPB Commonly known as:  INVANZ Inject 1 g into the vein daily for 7 days. Indication:  Salmonella typhi bacteremia Last Day of Therapy:  06/25/18 Labs - Once weekly:  CBC/D and BMP, Labs - Every other week:  ESR and CRP Ask about: Should I take this medication?            Home Infusion Instuctions  (From admission, onward)         Start     Ordered   06/18/18 0000  Home infusion instructions [facility] May follow [place] Dosing Protocol; May administer Cathflo as needed to maintain patency of vascular access device.; Flushing of vascular access device: per [facility] Protocol: 0.9% NaCl pre/post  medica...    Question Answer Comment  Instructions May follow [place] Dosing Protocol   Instructions May administer Cathflo as needed to maintain patency of vascular access device.   Instructions Flushing of vascular access device: per [facility] Protocol: 0.9% NaCl pre/post medication administration and prn patency; Heparin 100 u/ml, 35m for implanted ports and Heparin 10u/ml, 533mfor all other central venous catheters.   Instructions May follow [facility] Anaphylaxis Protocol for First Dose Administration in the home: 0.9% NaCl at 25-50 ml/hr to maintain IV access for protocol meds. Epinephrine 0.3 ml IV/IM PRN and Benadryl 25-50 IV/IM PRN s/s of anaphylaxis.   Instructions [facility] Infusion Coordinator (RN) to assist per patient IV care needs in the home PRN.      06/18/18 1237          Disposition and follow-up:   [facility]  At the hospital follow up visit please address:  1.  Patient with drug resistant Salmonella bacteremia, discharge to continue IV antibiotic through Picc line. Please evaluate VS  and also make sure, PICC line remains intact.   2.Patient had some transaminitis: Likely in setting of infection and muscle injury. Please recheck LFTs at outpatient follow-up  2.  Labs / imaging needed at time of follow-up: CMP, CBC  3.  Pending labs/ test needing follow-up: none  Follow-up Appointments:  Follow-up Information    Joyce Number, MD. Go on 06/24/2018.   Specialty:  Internal Medicine Why:  _0 :Max Sane information: [place] [address] [address] Follow up.   Why:  A home health care nurse will assist you with your home IV antibiotics Contact information: [place] [address] [phone]           Hospital Course by problem list: Joyce Morgan is a 57 y.o female with recent travels to  [place] who presented to the ED with fevers, chills, diarrhea, and gram negative bacteremia per her PCP. She was empirically started on Meropenem and admitted to the hospital for further evaluations.  1. Salmonella bacteriemia: ResistantSalmonella on blood culture. ID was on board. She was treated with meropenem. She had some fluctuating symptoms including fever and fatigue during her hospitalization, but remained hemodynamically stable and improved over admission. Patient is discharged home to continue treatment with ertapenem for 7 more days through PICC line and with home nurse visit to assist with that.  2. Panyctopenia: Developed a transient pancytopenia with nadirs of WBC 2.8, Hgb 11.2, platelets 111. Likely due to infection, improving at time of discharge. Recommend recheck in follow up.   3. Transaminitis and elevated CK: Likely in setting of infection and muscle injury. CK improved with hydration.  4. Headache with Hx of migraine. On Sumatriptan at home: Patient had some episode of headache during admission that resolved with sumatriptan.    Discharge Vitals:   BP 135/84   Pulse 85   Temp 99.5 F (37.5 C) (Oral)   Resp 16   Ht 5' 2" (1.575 m)   Wt 75.3 kg   SpO2 100%   BMI 30.36 kg/m   Pertinent Labs, Studies, and Procedures:  CBC Latest Ref Rng & Units 06/18/2018 06/16/2018 06/15/2018  WBC 4.0 - 10.5 K/uL 4.6 3.1(L) 2.9(L)  Hemoglobin 12.0 - 15.0 g/dL 11.4(L) 11.7(L) 11.8(L)  Hematocrit 36.0 - 46.0 % 35.8(L) 34.5(L) 35.7(L)  Platelets 150 - 400 K/uL 280 181 152   CMP Latest Ref Rng & Units 06/18/2018 06/17/2018 06/16/2018  Glucose 70 - 99 mg/dL 121(H) 115(H) 113(H)  BUN 6 - 20 mg/dL _1 Creatinine 0.44 - 1.00 mg/dL 0.70 0.81 0.79  Sodium 135 - 145 mmol/L 132(L) 133(L) 134(L)  Potassium 3.5 - 5.1 mmol/L 3.4(L) 3.5 3.5  Chloride 98 - 111 mmol/L 101 99 102  CO2 22 - 32 mmol/L _2 Calcium 8.9 - 10.3 mg/dL 8.2(L) 8.8(L) 8.6(L)  Total Protein 6.5 - 8.1 g/dL  - - 6.2(L)  Total Bilirubin 0.3 - 1.2 mg/dL - - 0.8  Alkaline Phos 38 - 126 U/L - - 84  AST 15 - 41 U/L - - 167(H)  ALT 0 - 44 U/L - - 95(H)   CK: 1644-->541 Initial BC: SALMONELLA TYPHI      Ref Range & Units 2wk ago  Enterococcus species NOT DETECTED NONE DETECTEDAbnormal    Vancomycin resistance NOT DETECTED NONE DETECTEDAbnormal    Listeria monocytogenes NOT DETECTED NONE DETECTEDAbnormal    Staphylococcus species NOT DETECTED NONE DETECTEDAbnormal    Staphylococcus aureus (BCID) NOT DETECTED NONE DETECTEDAbnormal    Methicillin resistance NOT DETECTED NONE DETECTEDAbnormal    Streptococcus species NOT DETECTED NONE DETECTEDAbnormal    Streptococcus agalactiae NOT DETECTED NONE DETECTEDAbnormal    Streptococcus pneumoniae NOT DETECTED NONE DETECTEDAbnormal    Streptococcus pyogenes  NOT DETECTED NONE DETECTEDAbnormal    Acinetobacter baumannii NOT DETECTED NONE DETECTEDAbnormal    Enterobacteriaceae species NOT DETECTED DETECTEDAbnormal    Comment: CRITICAL RESULT CALLED TO, READ BACK BY AND VERIFIED WITH:  PHARMD P DANG 06/14/18 AT 1751 BY CM   Enterobacter cloacae complex NOT DETECTED NONE DETECTEDAbnormal    Escherichia coli NOT DETECTED NONE DETECTEDAbnormal         Then No growth for 5 days Discharge Instructions: Discharge Instructions    Call MD for:  persistant nausea and vomiting   Complete by:  As directed    Call MD for:  severe uncontrolled pain   Complete by:  As directed    Diet - low sodium heart healthy   Complete by:  As directed    Discharge instructions   Complete by:  As directed    Ms. Chilson, it was our pleasure taking care of you at [facility]. He was admitted due to fever and concerning for typhoid fever.. We treated you with antibiotic.  We are glad that you feel better.  We discharge you today to continue your antibiotic: Ertapenem at home through the PICC line that placed for you.  Please follow-up instruction and continue with  antibiotic for 7 more days (end date: 06/25/2018).  You can use Tylenol as needed if develop fever.  Also as this illness is contagious through GI system, spreading can be controlled by washing hands very well before cooking and after using bathroom. You can call us at [phone] you have any questions or concerns.  As always, if not improve or if symptoms got worse, please seek medical attention at the emergency room.  Thank you,  Dr. Linna Morgan   Home infusion instructions [place] May follow [place] Dosing Protocol; May administer Cathflo as needed to maintain patency of vascular access device.; Flushing of vascular access device: per [facility] Protocol: 0.9% NaCl pre/post medica...   Complete by:  As directed    Instructions:  May follow [place] Dosing Protocol   Instructions:  May administer Cathflo as needed to maintain patency of vascular access device.   Instructions:  Flushing of vascular access device: per [facility] Protocol: 0.9% NaCl pre/post medication administration and prn patency; Heparin 100 u/ml, 72m for implanted ports and Heparin 10u/ml, 58mfor all other central venous catheters.   Instructions:  May follow [facility] Anaphylaxis Protocol for First Dose Administration in the home: 0.9% NaCl at 25-50 ml/hr to maintain IV access for protocol meds. Epinephrine 0.3 ml IV/IM PRN and Benadryl 25-50 IV/IM PRN s/s of anaphylaxis.   Instructions:  [facility] Coord[facility] (RN) to assist per patient IV care needs in the home PRN.   Increase activity slowly   Complete by:  As directed       Signed: MaDewayne HatchMD 06/28/2018, 10:49 PM   Pager: [phone] Medicine Attending Note:  I saw and examined the patient on the day of discharge. I reviewed and agree with the discharge summary written by the house staff with edits by me as necessary.  Joyce PressmanM.D., Ph.D.

## 2018-06-18 NOTE — Care Management Note (Signed)
Case Management Note  Patient Details  Name: Joyce Morgan MRN: [id number] Date of Birth: 11/12/1960  Subjective/Objective:     [place], Typhoid Fever              Action/Plan: Patient lives at home with family; Shelbie Ammons, MD; has private insurance with BCBS with prescription drug coverage; patient needs home IV antibiotics at dc; HHC choice offered, pt chose [facility]; Pam RN with [facility] called, they have accepted the patient.   Expected Discharge Date:  06/18/18               Expected Discharge Plan:  Home w Home Health Services  Discharge planning Services  CM Consult  Choice offered to:  Patient, Adult Children  HH Arranged:  RN [facility] Agency:  [facility]  Status of Service:  In process, will continue to follow  Reola Mosher [phone] 06/18/2018, 4:11 PM

## 2018-06-18 NOTE — Progress Notes (Signed)
Infectious Diseases Pharmacy Note   Ertapenem 1 gm scheduled to be given this afternoon before patient discharge to fit with [facility] Protocol.    Sharin MonsEmily Sinclair, PharmD, BCPS  Infectious Diseases Clinical Pharmacist Phone: [phone]

## 2018-06-18 NOTE — Progress Notes (Signed)
   Subjective: [name] was seen and evaluated today.  She says that she feels well today.  Denies any headache, chills, abdominal pain, nausea or vomiting.  Did not have any diarrhea.  We talked about possible discharge today after placing discharge and the plan for continuing her antibiotic therapy for another week at home to PICC line.  She agrees with the plan.  Objective:  Vital signs in last 24 hours: Vitals:   06/17/18 2038 06/18/18 0657 06/18/18 0658 06/18/18 0700  BP: 114/72 135/84    Pulse: 87 (!) 117  85  Resp:  16    Temp: 98.5 F (36.9 C) 99.5 F (37.5 C)    TempSrc: Oral Oral    SpO2: 96% (!) 82% 100%   Weight:   75.3 kg   Height:       Physical  exam: Pleasant lady, well-developed and well-nourished. sitting on the bed in no acute distress Head: Normocephalic and atraumatic CV: RRR, no murmur Lungs: Normal work of breathing, no wheeze, no real Abdomen: Soft and nontender, bowel sounds are present. Extremities: No lower extremity edema, pulses are present and normal bilaterally Skin: No rash, is warm and dry Allergy: Is alert and oriented x3 Psychiatric: Patient has normal mood and affect and normal behavior and judgment  Assessment/Plan:  Principal Problem:   Salmonella bacteremia Active Problems:   Sepsis (HCC)   Pancytopenia (HCC)   Non-traumatic rhabdomyolysis   Joyce Morgan is a 57 y.o female with recent travels to [place] who presented to the ED with fevers, chills, diarrhea, and gram negative bacteremia per her PCP. She was empirically started on Meropenem and admitted to the hospital for further evaluations.  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 Carbapenem class as the only treatment option. No more fever since last evening. She reports improvement today.  -PICC line placement -Discharge home to continue treatment with ertapenem for 7 days through PICC line -Home nurse visit to assist with IV treatment and check  IV line   Transaminates:  Likely in setting of infection and muscle injury.  -Check CMP at outpatient follow-up  Headache with Hx of migraine. On Sumatriptan at home Resolved with sumatriptan.  No more headache today -Continue home treatment after discharge  Dispo: Anticipated discharge today after PICC line placement   Chevis PrettyMasoudi, Joyce Kates, MD 06/18/2018, 8:45 AM Pager: [phone]

## 2018-06-18 NOTE — Progress Notes (Signed)
OK to place PICC line today, per MD.

## 2018-06-18 NOTE — Progress Notes (Signed)
Peripherally Inserted Central Catheter/Midline Placement  The IV Nurse has discussed with the patient and/or persons authorized to consent for the patient, the purpose of this procedure and the potential benefits and risks involved with this procedure.  The benefits include less needle sticks, lab draws from the catheter, and the patient may be discharged home with the catheter. Risks include, but not limited to, infection, bleeding, blood clot (thrombus formation), and puncture of an artery; nerve damage and irregular heartbeat and possibility to perform a PICC exchange if needed/ordered by physician.  Alternatives to this procedure were also discussed.  Bard Power PICC patient education guide, fact sheet on infection prevention and patient information card has been provided to patient /or left at bedside.    PICC/Midline Placement Documentation  PICC Single Lumen 06/11/18 PICC Right Brachial 34 cm 2 cm (Active)  Indication for Insertion or Continuance of Line Home intravenous therapies (PICC only) 06/18/2018 11:59 AM  Exposed Catheter (cm) 0 cm 06/18/2018 11:59 AM  Site Assessment Clean;Dry;Intact 06/18/2018 11:59 AM  Line Status Flushed;Blood return noted;Saline locked 06/18/2018 11:59 AM  Dressing Type Transparent 06/18/2018 11:59 AM  Dressing Status Clean;Dry;Intact 06/18/2018 11:59 AM  Dressing Change Due 06/25/18 06/18/2018 11:59 AM       Conrad BurlingtonByerly, Dasani Thurlow Ramos 06/18/2018, 12:00 PM

## 2018-06-21 LAB — CULTURE, BLOOD (ROUTINE X 2)
Culture: NO GROWTH
Culture: NO GROWTH
SPECIAL REQUESTS: ADEQUATE
Special Requests: ADEQUATE

## 2018-06-24 ENCOUNTER — Encounter: Payer: Self-pay | Admitting: Internal Medicine

## 2018-06-24 ENCOUNTER — Ambulatory Visit (INDEPENDENT_AMBULATORY_CARE_PROVIDER_SITE_OTHER): Payer: BLUE CROSS/BLUE SHIELD | Admitting: Internal Medicine

## 2018-06-24 DIAGNOSIS — D61818 Other pancytopenia: Secondary | ICD-10-CM

## 2018-06-24 DIAGNOSIS — Z452 Encounter for adjustment and management of vascular access device: Secondary | ICD-10-CM

## 2018-06-24 DIAGNOSIS — R7881 Bacteremia: Secondary | ICD-10-CM | POA: Diagnosis not present

## 2018-06-24 NOTE — Progress Notes (Signed)
   Subjective:    Patient ID: Joyce Morgan, female    DOB: 01-16-1961, 57 y.o.   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  Resistant to ceftriaxone, only sensitive to carbapenem.  Feeling better but still tired.  Overall improved.  Eating, no further fever.  Follow up blood cultures remained negative.  Due to end ertapenem tomorrow.    Review of Systems  Constitutional: Positive for fatigue. Negative for appetite change, chills and fever.       Objective:   Physical Exam  Constitutional: She appears well-developed and well-nourished. No distress.  HENT:  Mouth/Throat: No oropharyngeal exudate.  Eyes: No scleral icterus.  Cardiovascular: Normal rate, regular rhythm and normal heart sounds.  No murmur heard. Pulmonary/Chest: Effort normal and breath sounds normal. No respiratory distress.  Skin: No rash noted.   SH: no tobacco       Assessment & Plan:

## 2018-06-24 NOTE — Assessment & Plan Note (Signed)
Mainly leukopenia.  Was wnl when she left.  Rechecked today and will follow results.

## 2018-06-24 NOTE — Patient Instructions (Signed)
Dr. Baker Pierinihris Weaver, [facility] [phone]

## 2018-06-24 NOTE — Progress Notes (Signed)
Called [facility] and gave verbal orders per Dr. Comer to Debbie to remove Picc line after last dose of antibiotics. VORB and understood. Patient made aware during today's office. 

## 2018-06-24 NOTE — Assessment & Plan Note (Signed)
She is slowly improving.  Treatment through tomorrow and stop.

## 2018-06-24 NOTE — Assessment & Plan Note (Signed)
Doing well, no issues.  Will be removed tomorrow by home health.

## 2018-06-28 LAB — CULTURE, BLOOD (ROUTINE X 2)

## 2019-10-03 ENCOUNTER — Ambulatory Visit: Payer: Self-pay | Attending: Internal Medicine

## 2019-10-03 DIAGNOSIS — Z23 Encounter for immunization: Secondary | ICD-10-CM | POA: Insufficient documentation

## 2019-10-03 NOTE — Progress Notes (Signed)
   Covid-19 Vaccination Clinic  Name:  Finlay Mills    MRN: [id number] DOB: 11-13-1960  10/03/2019  Ms. Stogner was observed post Covid-19 immunization for 15 minutes without incidence. She was provided with Vaccine Information Sheet and instruction to access the V-Safe system.   Ms. Vine was instructed to call 911 with any severe reactions post vaccine: Marland Kitchen Difficulty breathing  . Swelling of your face and throat  . A fast heartbeat  . A bad rash all over your body  . Dizziness and weakness    Immunizations Administered    Name Date Dose VIS Date Route   Pfizer COVID-19 Vaccine 10/03/2019  2:44 PM 0.3 mL 07/18/2019 Intramuscular   Manufacturer: [employer], [employer]   Lot: [id number]   NDC: [address]-0

## 2019-10-29 ENCOUNTER — Ambulatory Visit: Payer: Self-pay | Attending: Internal Medicine

## 2019-10-29 DIAGNOSIS — Z23 Encounter for immunization: Secondary | ICD-10-CM

## 2019-10-29 NOTE — Progress Notes (Signed)
   Covid-19 Vaccination Clinic  Name:  Joyce Morgan    MRN: [id number] DOB: 01-21-1961  10/29/2019  [name] was observed post Covid-19 immunization for 15 minutes without incident. She was provided with Vaccine Information Sheet and instruction to access the V-Safe system.   [name] was instructed to call 911 with any severe reactions post vaccine: Marland Kitchen Difficulty breathing  . Swelling of face and throat  . A fast heartbeat  . A bad rash all over body  . Dizziness and weakness   Immunizations Administered    Name Date Dose VIS Date Route   Pfizer COVID-19 Vaccine 10/29/2019 12:01 PM 0.3 mL 07/18/2019 Intramuscular   Manufacturer: [employer], [employer]   Lot: [id number]   NDC: [address]-5

## 2019-12-12 IMAGING — MG 2D DIGITAL SCREENING BILATERAL MAMMOGRAM WITH 3D TOMO WITH CAD
6 of 10 series · 6 of 26 positions shown · non-contrast
Comparison: None.

CLINICAL DATA: Screening.

EXAM:
2D DIGITAL SCREENING BILATERAL MAMMOGRAM WITH 3D TOMO WITH CAD

[R MLO (1 of 3)]
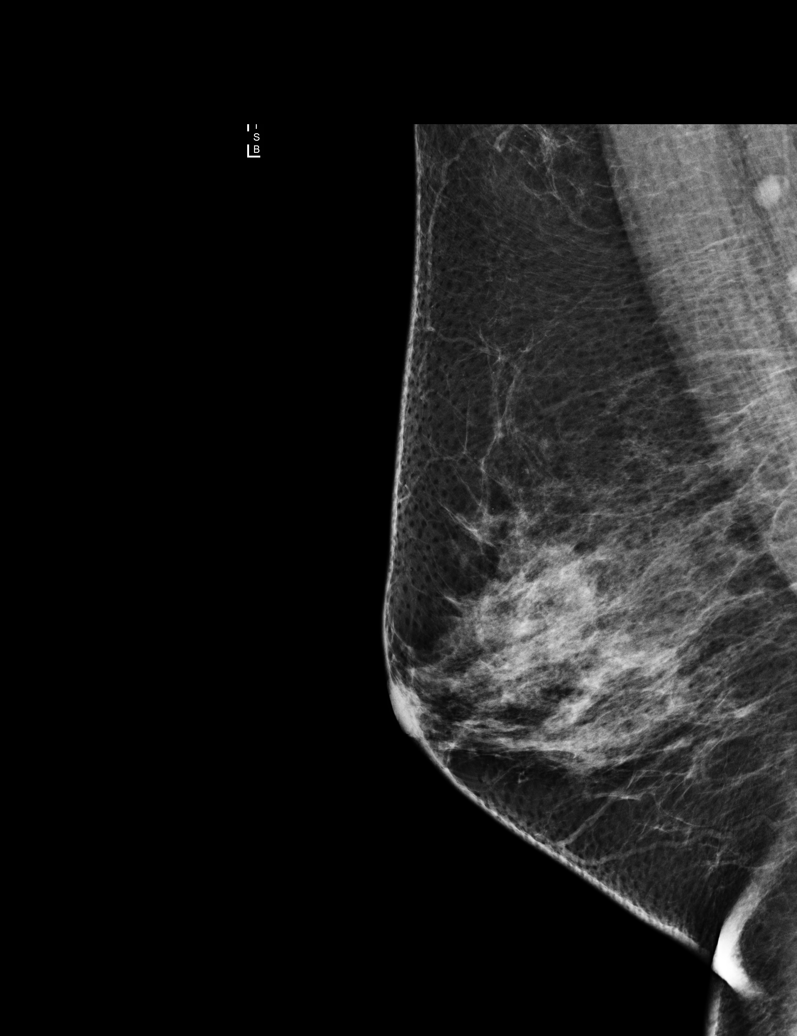

[R MLO (2 of 3)]
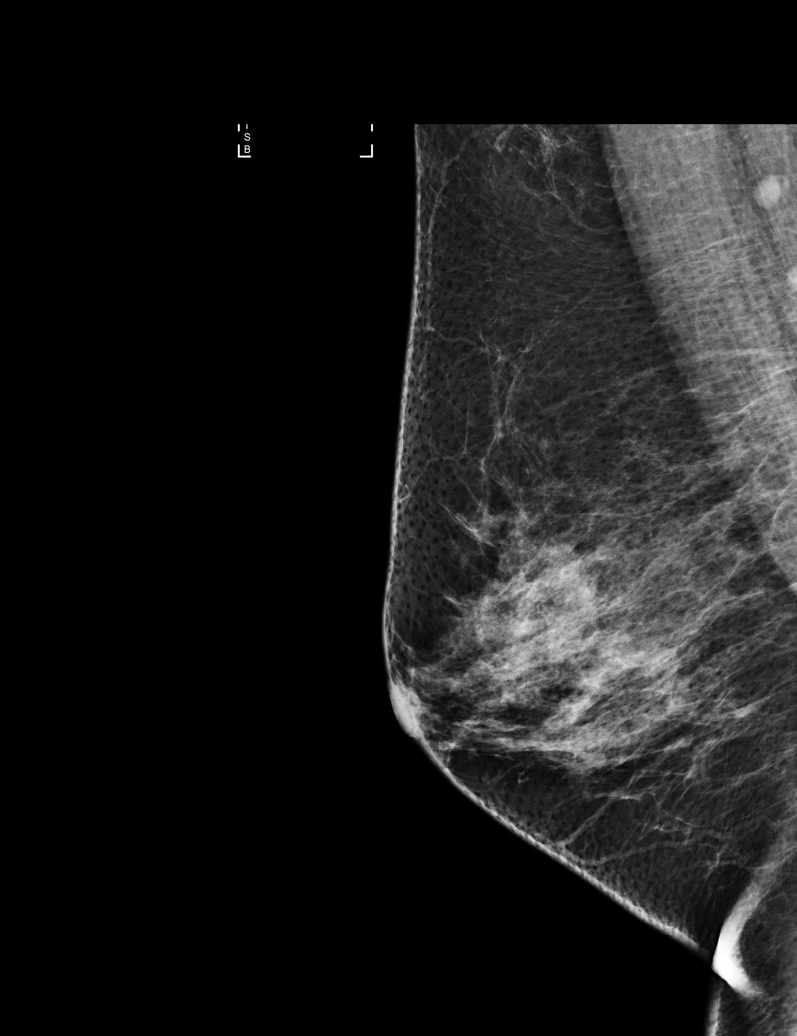

[L MLO]
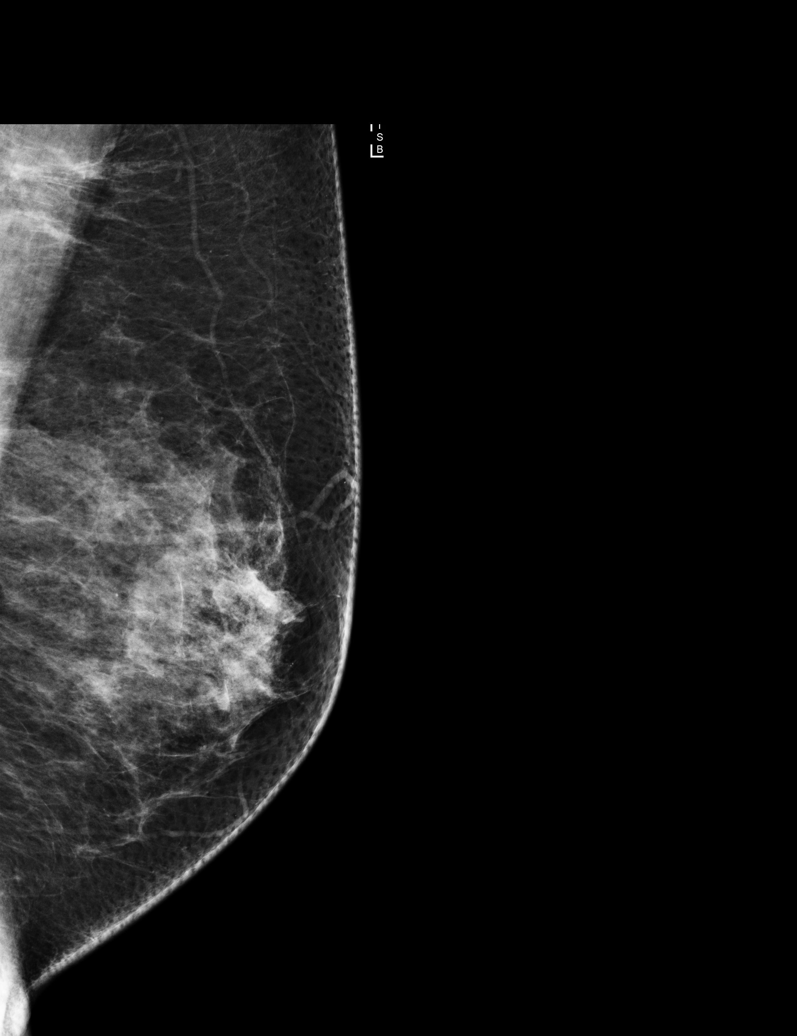

[R CC]
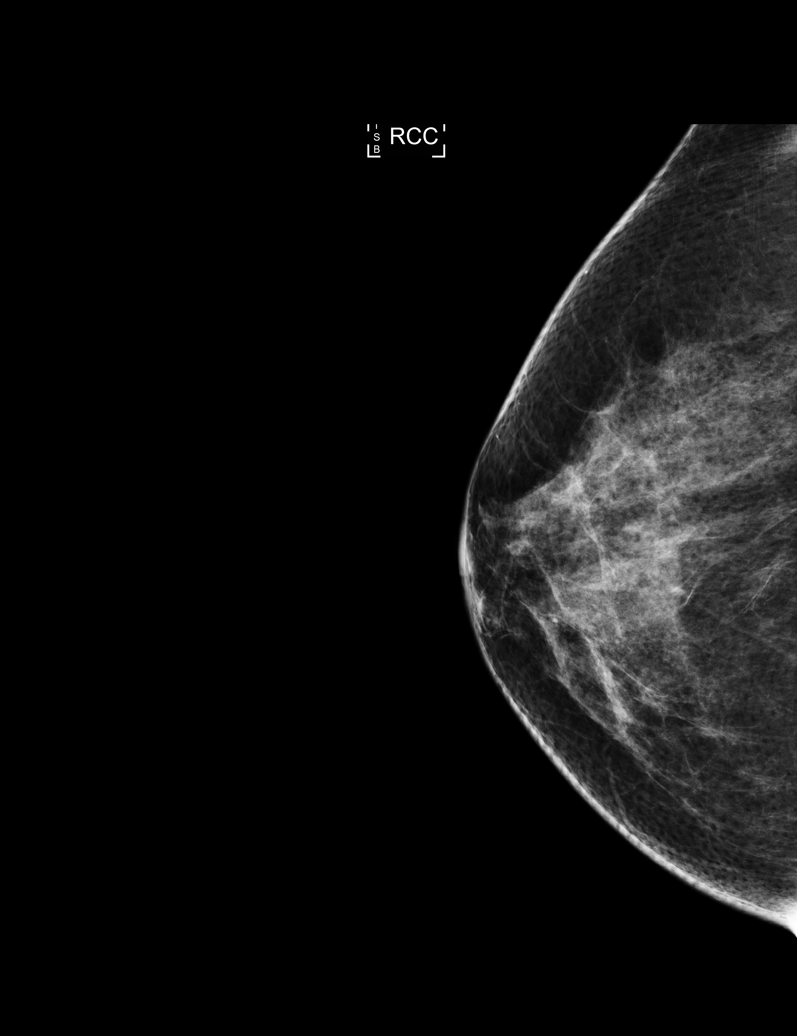

[R MLO (3 of 3)]
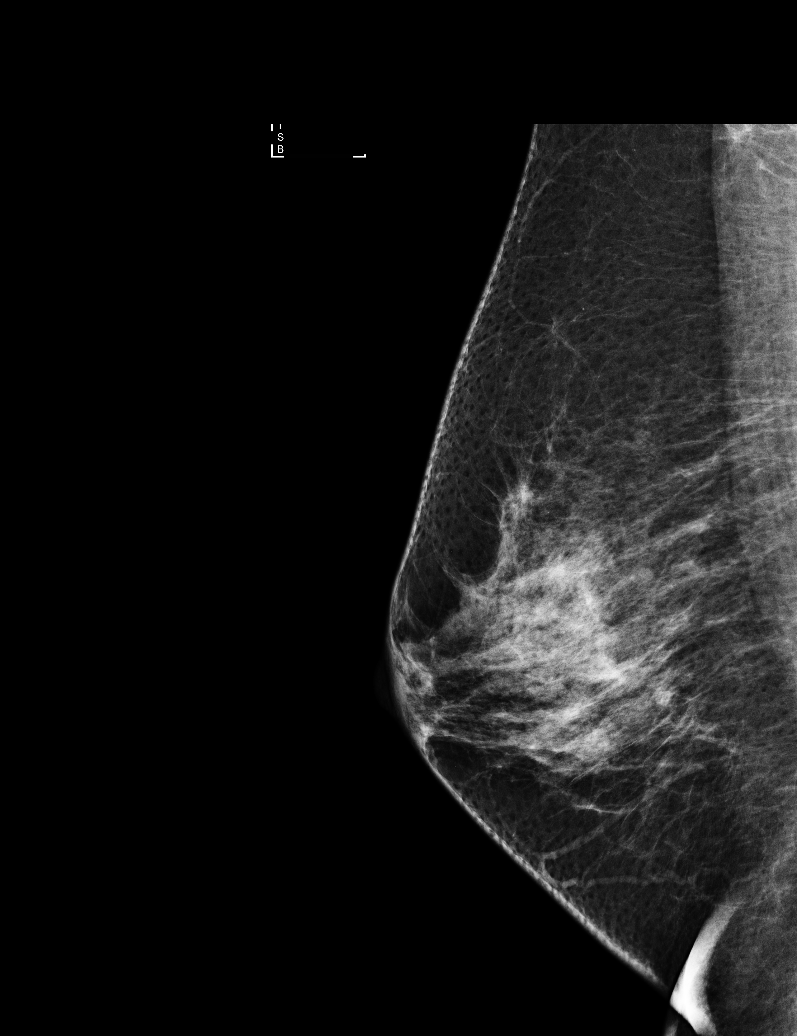

[L CC]
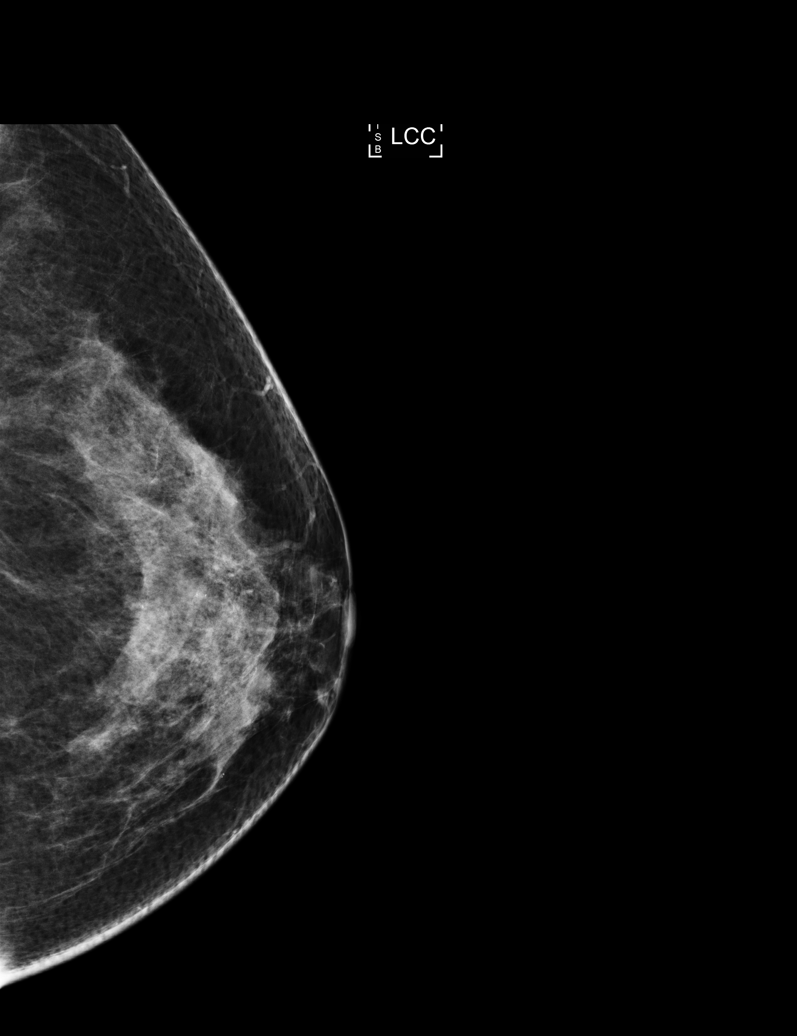

[6 of 26 positions shown; findings below may reference images not displayed]

ACR Breast Density Category c: The breast tissue is heterogeneously
dense, which may obscure small masses
FINDINGS: There are no findings suspicious for malignancy. Images were
processed with CAD.
IMPRESSION: No mammographic evidence of malignancy. A result letter of this
screening mammogram will be mailed directly to the patient.

RECOMMENDATION:
Screening mammogram in one year. (Code:9F-[id number])

BI-RADS CATEGORY  1: Negative.

## 2020-09-25 IMAGING — DX DG CHEST 2V
2 series · 2 of 2 positions shown · non-contrast
Comparison: None.

CLINICAL DATA: Fever.

EXAM:
CHEST - 2 VIEW

[chest pa]
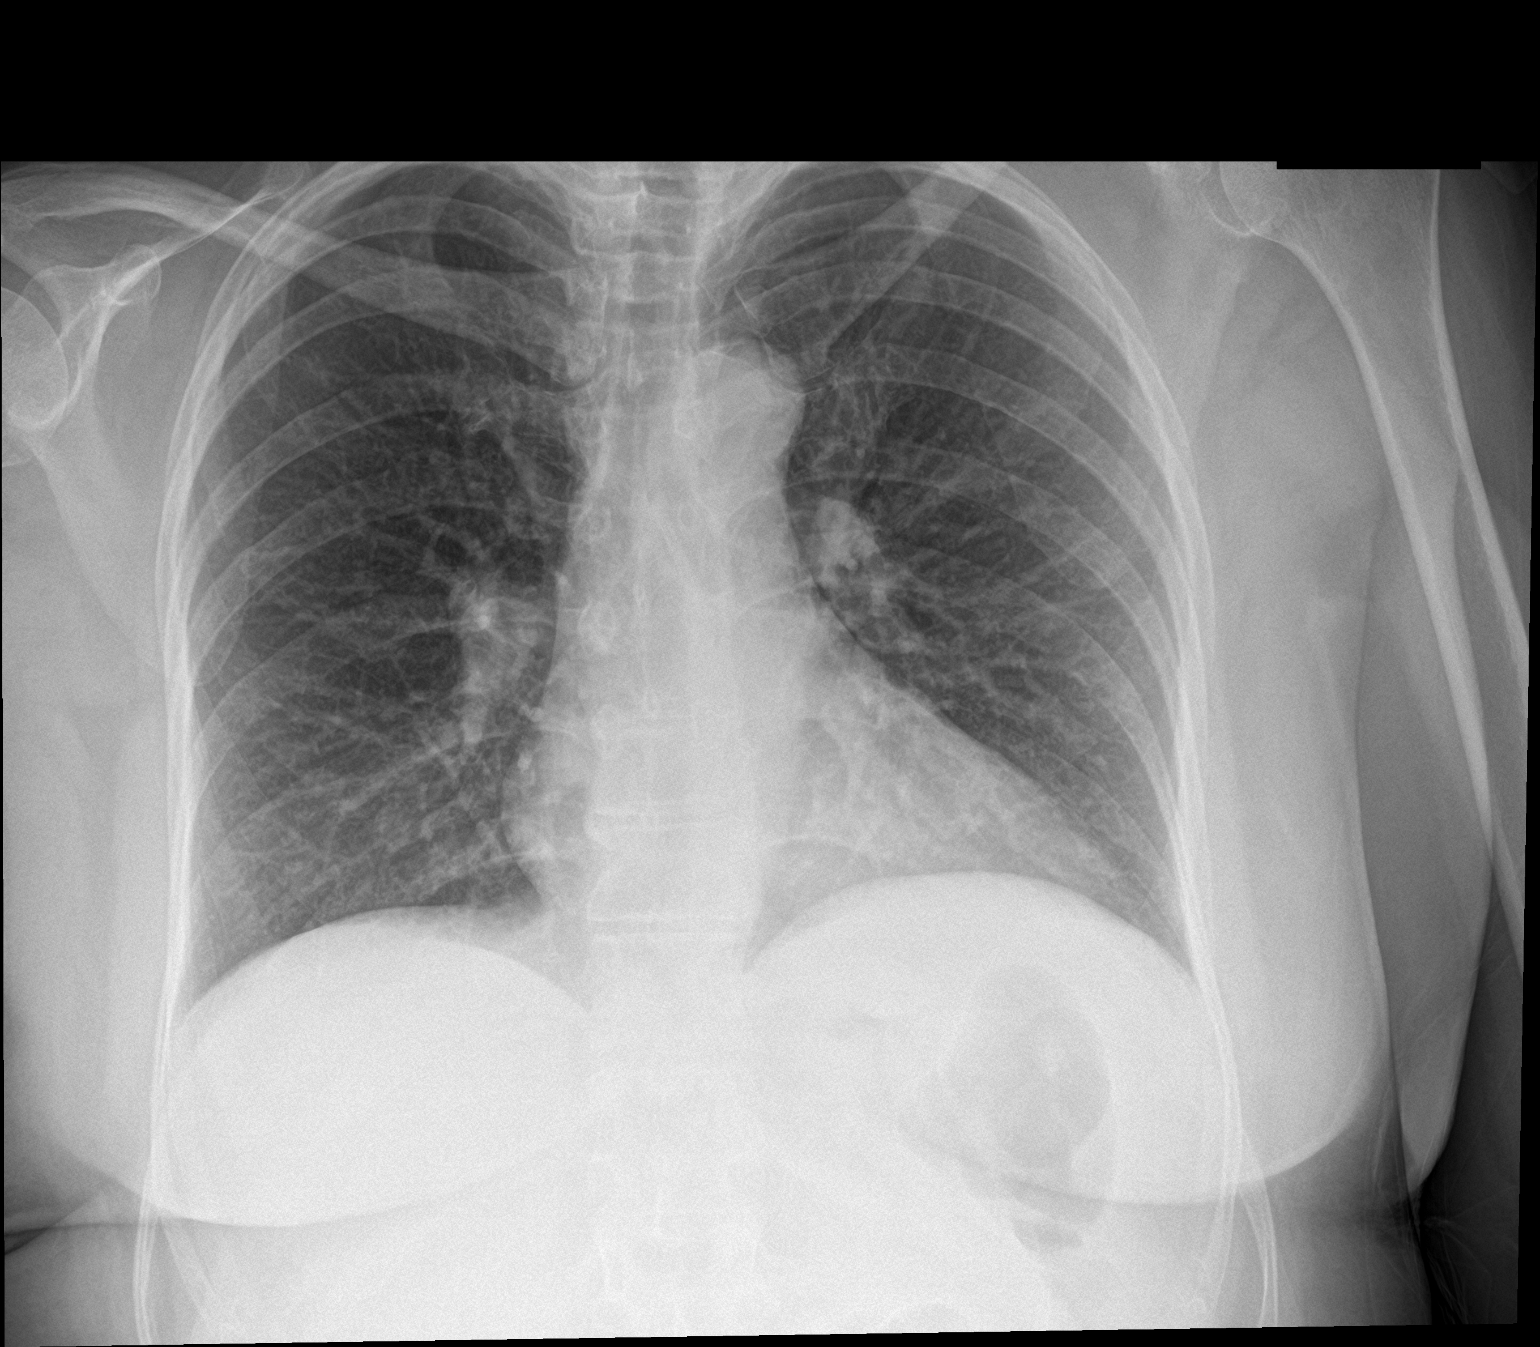

[chest lat]
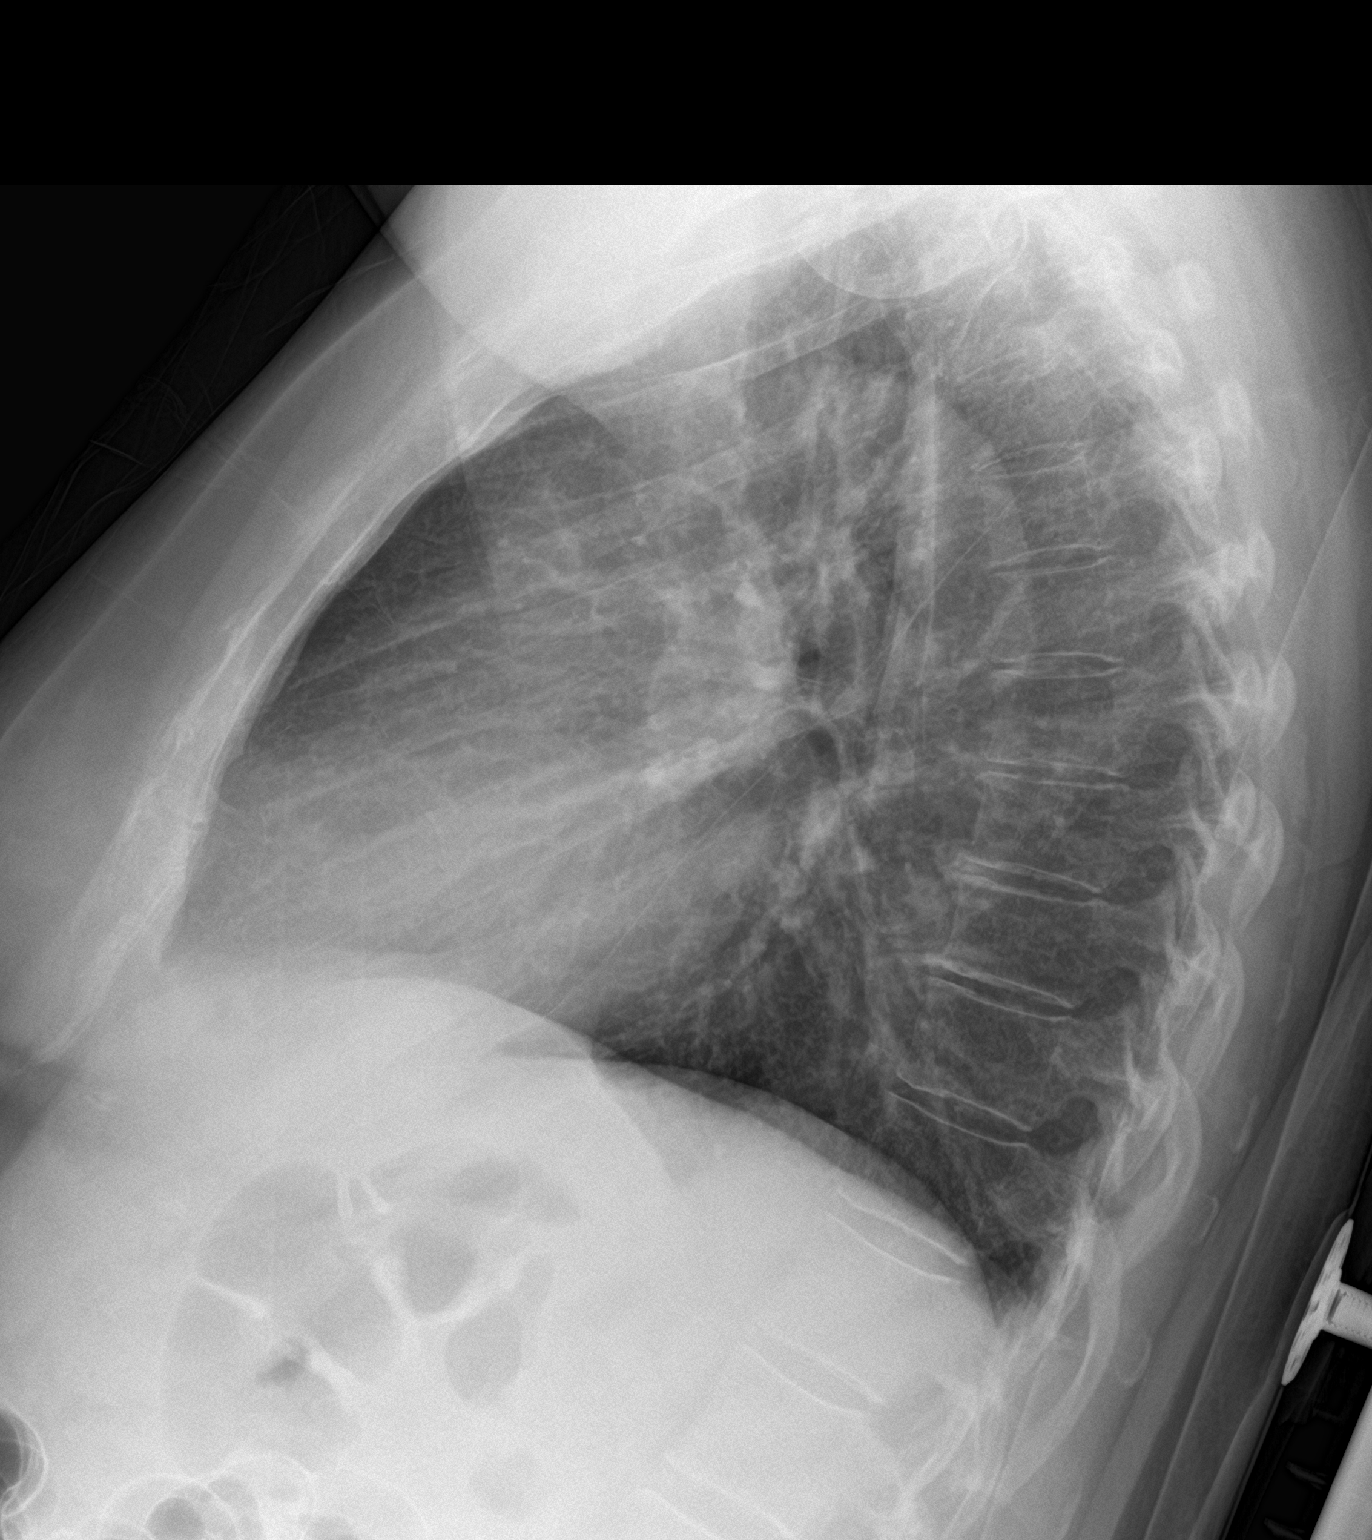

[2 of 2 positions shown; findings below may reference images not displayed]

FINDINGS: The heart size and mediastinal contours are within normal limits.
Both lungs are clear. The visualized skeletal structures are
unremarkable.
IMPRESSION: No active cardiopulmonary disease.
